# Patient Record
Sex: Male | Born: 1960 | Race: White | Hispanic: No | Marital: Married | Smoking: Never smoker
Health system: Southern US, Community
[De-identification: ages and names within clinical notes are randomized; demographics above are authoritative.]

## PROBLEM LIST (undated history)

## (undated) DIAGNOSIS — M109 Gout, unspecified: Secondary | ICD-10-CM

## (undated) DIAGNOSIS — E739 Lactose intolerance, unspecified: Secondary | ICD-10-CM

## (undated) DIAGNOSIS — I10 Essential (primary) hypertension: Secondary | ICD-10-CM

## (undated) DIAGNOSIS — Z9889 Other specified postprocedural states: Secondary | ICD-10-CM

## (undated) DIAGNOSIS — R112 Nausea with vomiting, unspecified: Secondary | ICD-10-CM

## (undated) DIAGNOSIS — T884XXA Failed or difficult intubation, initial encounter: Secondary | ICD-10-CM

## (undated) DIAGNOSIS — A159 Respiratory tuberculosis unspecified: Secondary | ICD-10-CM

## (undated) DIAGNOSIS — G4730 Sleep apnea, unspecified: Secondary | ICD-10-CM

## (undated) DIAGNOSIS — J45909 Unspecified asthma, uncomplicated: Secondary | ICD-10-CM

## (undated) DIAGNOSIS — T8859XA Other complications of anesthesia, initial encounter: Secondary | ICD-10-CM

## (undated) DIAGNOSIS — R519 Headache, unspecified: Secondary | ICD-10-CM

## (undated) DIAGNOSIS — F909 Attention-deficit hyperactivity disorder, unspecified type: Secondary | ICD-10-CM

## (undated) DIAGNOSIS — E119 Type 2 diabetes mellitus without complications: Secondary | ICD-10-CM

## (undated) DIAGNOSIS — M1990 Unspecified osteoarthritis, unspecified site: Secondary | ICD-10-CM

## (undated) HISTORY — PX: NASAL SINUS SURGERY: SHX719

## (undated) HISTORY — PX: TONSILLECTOMY AND ADENOIDECTOMY: SHX28

## (undated) HISTORY — DX: Sleep apnea, unspecified: G47.30

## (undated) HISTORY — DX: Lactose intolerance, unspecified: E73.9

## (undated) HISTORY — DX: Essential (primary) hypertension: I10

## (undated) HISTORY — DX: Type 2 diabetes mellitus without complications: E11.9

## (undated) HISTORY — DX: Headache, unspecified: R51.9

## (undated) HISTORY — PX: LAPAROSCOPIC GASTRIC SLEEVE RESECTION: SHX5895

## (undated) HISTORY — DX: Unspecified asthma, uncomplicated: J45.909

## (undated) HISTORY — PX: OTHER SURGICAL HISTORY: SHX169

---

## 2002-08-15 ENCOUNTER — Encounter: Admission: RE | Admit: 2002-08-15 | Discharge: 2002-10-26 | Payer: Self-pay | Admitting: Sports Medicine

## 2002-10-21 ENCOUNTER — Encounter: Payer: Self-pay | Admitting: Sports Medicine

## 2002-10-21 ENCOUNTER — Ambulatory Visit (HOSPITAL_COMMUNITY): Admission: RE | Admit: 2002-10-21 | Discharge: 2002-10-21 | Payer: Self-pay | Admitting: Sports Medicine

## 2002-11-07 ENCOUNTER — Encounter: Admission: RE | Admit: 2002-11-07 | Discharge: 2002-12-27 | Payer: Self-pay | Admitting: Sports Medicine

## 2003-08-02 ENCOUNTER — Encounter: Admission: RE | Admit: 2003-08-02 | Discharge: 2003-08-02 | Payer: Self-pay | Admitting: Sports Medicine

## 2003-08-15 ENCOUNTER — Encounter: Admission: RE | Admit: 2003-08-15 | Discharge: 2003-08-15 | Payer: Self-pay | Admitting: Sports Medicine

## 2003-08-29 ENCOUNTER — Encounter: Admission: RE | Admit: 2003-08-29 | Discharge: 2003-08-29 | Payer: Self-pay | Admitting: Sports Medicine

## 2003-11-16 ENCOUNTER — Ambulatory Visit (HOSPITAL_COMMUNITY): Admission: RE | Admit: 2003-11-16 | Discharge: 2003-11-16 | Payer: Self-pay | Admitting: Neurosurgery

## 2003-12-01 ENCOUNTER — Encounter: Admission: RE | Admit: 2003-12-01 | Discharge: 2003-12-01 | Payer: Self-pay | Admitting: Neurosurgery

## 2004-03-07 ENCOUNTER — Inpatient Hospital Stay (HOSPITAL_COMMUNITY): Admission: RE | Admit: 2004-03-07 | Discharge: 2004-03-09 | Payer: Self-pay | Admitting: Neurosurgery

## 2006-12-10 ENCOUNTER — Emergency Department (HOSPITAL_COMMUNITY): Admission: EM | Admit: 2006-12-10 | Discharge: 2006-12-10 | Payer: Self-pay | Admitting: Family Medicine

## 2009-08-21 ENCOUNTER — Emergency Department (HOSPITAL_COMMUNITY): Admission: EM | Admit: 2009-08-21 | Discharge: 2009-08-21 | Payer: Self-pay | Admitting: Family Medicine

## 2010-09-05 NOTE — Op Note (Signed)
NAMEGURKARAN, RAHM               ACCOUNT NO.:  [id number]   MEDICAL RECORD NO.:  [id number]          PATIENT TYPE:  INP   LOCATION:  3031                         FACILITY:  [facility]   PHYSICIAN:  Hilda Lias, M.D.   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   PROCEDURE:  Bilateral T10-11 laminectomy and decompression of spinal cord.   SURGEON:  Hilda Lias, M.D.   ASSISTANT:  Lovell Sheehan.   CLINICAL HISTORY:  The patient was admitted because of back pain that  radiates to the buttocks area.  The patient has had this problem since 4/04  where he was _____.  The patient had a lumbar MRI, which was unremarkable,  but the thoracic MRI showed that he has stenosis at the level of 10-11  secondary to degenerative disk disease and calcified yellow ligament to the  facet.  The patient wished to proceed with surgery.  The risks were  explained to him, including the possibility of infection, CSF leak, no  improvement whatsoever, and need for further surgery.   PROCEDURE IN DETAIL:  The patient was taken to the OR.  The back was sprayed  with Betadine.  An x-ray was taken, which showed that the upper mid was at  the level 5 thoracic level.  From then on, we opened the midline from T10 to  T11.  We retracted the muscle all the way laterally.  We repeated another x-  ray, which showed that indeed we were at T10-11.  This spinous process was  removed by using the drill with the retractor.  Indeed, we found quite a bit  of calcified ligament, and decompression of the spinal cord was achieved.  above 10 and below 11, he had plenty of space there.  Having done this, the  area was irrigated.  Valsalva maneuver was negative.  The wound was closed  with Vicryl and Steri-Strips.  _____ was  left in the dural space.       EB/MEDQ  D:  03/07/2004  T:  03/08/2004  Job:  [id number]

## 2013-05-19 DIAGNOSIS — M5136 Other intervertebral disc degeneration, lumbar region: Secondary | ICD-10-CM | POA: Insufficient documentation

## 2014-09-14 DIAGNOSIS — E669 Obesity, unspecified: Secondary | ICD-10-CM | POA: Insufficient documentation

## 2014-09-14 DIAGNOSIS — I10 Essential (primary) hypertension: Secondary | ICD-10-CM | POA: Insufficient documentation

## 2014-09-14 DIAGNOSIS — D141 Benign neoplasm of larynx: Secondary | ICD-10-CM | POA: Insufficient documentation

## 2014-12-27 DIAGNOSIS — M545 Low back pain, unspecified: Secondary | ICD-10-CM | POA: Insufficient documentation

## 2014-12-27 DIAGNOSIS — G8929 Other chronic pain: Secondary | ICD-10-CM | POA: Insufficient documentation

## 2015-04-04 DIAGNOSIS — R9389 Abnormal findings on diagnostic imaging of other specified body structures: Secondary | ICD-10-CM | POA: Insufficient documentation

## 2015-04-11 DIAGNOSIS — R7309 Other abnormal glucose: Secondary | ICD-10-CM | POA: Insufficient documentation

## 2015-05-10 DIAGNOSIS — M4800 Spinal stenosis, site unspecified: Secondary | ICD-10-CM | POA: Insufficient documentation

## 2015-06-19 DIAGNOSIS — G4730 Sleep apnea, unspecified: Secondary | ICD-10-CM | POA: Insufficient documentation

## 2015-06-25 DIAGNOSIS — R7989 Other specified abnormal findings of blood chemistry: Secondary | ICD-10-CM | POA: Insufficient documentation

## 2015-06-25 DIAGNOSIS — E559 Vitamin D deficiency, unspecified: Secondary | ICD-10-CM | POA: Insufficient documentation

## 2015-06-27 DIAGNOSIS — D229 Melanocytic nevi, unspecified: Secondary | ICD-10-CM | POA: Insufficient documentation

## 2015-06-27 DIAGNOSIS — D2372 Other benign neoplasm of skin of left lower limb, including hip: Secondary | ICD-10-CM | POA: Insufficient documentation

## 2015-07-16 HISTORY — PX: COLONOSCOPY: SHX174

## 2015-07-25 DIAGNOSIS — G4733 Obstructive sleep apnea (adult) (pediatric): Secondary | ICD-10-CM | POA: Insufficient documentation

## 2016-04-07 DIAGNOSIS — F32A Depression, unspecified: Secondary | ICD-10-CM | POA: Insufficient documentation

## 2017-09-27 DIAGNOSIS — M797 Fibromyalgia: Secondary | ICD-10-CM | POA: Diagnosis not present

## 2017-09-27 DIAGNOSIS — I10 Essential (primary) hypertension: Secondary | ICD-10-CM | POA: Diagnosis not present

## 2017-09-27 DIAGNOSIS — E291 Testicular hypofunction: Secondary | ICD-10-CM | POA: Diagnosis not present

## 2017-09-27 DIAGNOSIS — Z79899 Other long term (current) drug therapy: Secondary | ICD-10-CM | POA: Diagnosis not present

## 2017-10-07 DIAGNOSIS — I10 Essential (primary) hypertension: Secondary | ICD-10-CM | POA: Diagnosis not present

## 2017-10-07 DIAGNOSIS — N529 Male erectile dysfunction, unspecified: Secondary | ICD-10-CM | POA: Diagnosis not present

## 2017-10-07 DIAGNOSIS — G4733 Obstructive sleep apnea (adult) (pediatric): Secondary | ICD-10-CM | POA: Diagnosis not present

## 2017-10-07 DIAGNOSIS — M47812 Spondylosis without myelopathy or radiculopathy, cervical region: Secondary | ICD-10-CM | POA: Diagnosis not present

## 2017-10-07 DIAGNOSIS — R51 Headache: Secondary | ICD-10-CM | POA: Diagnosis not present

## 2017-10-07 DIAGNOSIS — M5136 Other intervertebral disc degeneration, lumbar region: Secondary | ICD-10-CM | POA: Diagnosis not present

## 2017-10-07 DIAGNOSIS — E291 Testicular hypofunction: Secondary | ICD-10-CM | POA: Diagnosis not present

## 2017-10-07 DIAGNOSIS — H9319 Tinnitus, unspecified ear: Secondary | ICD-10-CM | POA: Diagnosis not present

## 2017-10-07 DIAGNOSIS — M797 Fibromyalgia: Secondary | ICD-10-CM | POA: Diagnosis not present

## 2017-10-07 DIAGNOSIS — A0100 Typhoid fever, unspecified: Secondary | ICD-10-CM | POA: Diagnosis not present

## 2017-10-11 ENCOUNTER — Ambulatory Visit (INDEPENDENT_AMBULATORY_CARE_PROVIDER_SITE_OTHER): Payer: Medicare HMO | Admitting: Cardiovascular Disease

## 2017-10-11 ENCOUNTER — Encounter: Payer: Self-pay | Admitting: Cardiovascular Disease

## 2017-10-11 VITALS — BP 138/88 | HR 57 | Ht 73.0 in | Wt 250.0 lb

## 2017-10-11 DIAGNOSIS — R079 Chest pain, unspecified: Secondary | ICD-10-CM

## 2017-10-11 DIAGNOSIS — R002 Palpitations: Secondary | ICD-10-CM | POA: Diagnosis not present

## 2017-10-11 DIAGNOSIS — I10 Essential (primary) hypertension: Secondary | ICD-10-CM | POA: Diagnosis not present

## 2017-10-11 NOTE — Progress Notes (Signed)
CARDIOLOGY CONSULT NOTE  Patient ID: PEDER ALLUMS MRN: [id number] DOB/AGE: 55-Nov-1962 55 y.o.  Admit date: (Not on file) Primary Physician: Deland Pretty, MD Referring Physician: Deland Pretty, MD  Reason for Consultation: Palpitations  HPI: Luke Austin is a 57 y.o. male who is being seen today for the evaluation of palpitations at the request of Deland Pretty, MD.   Past medical history includes hypertension.  I reviewed notes from his PCP whom he saw on 10/07/2017.  He has a history of degenerative joint disease of the spine and has undergone surgery.  He underwent bariatric surgery in 2017.  Blood pressure was 152/100 at his PCPs office.  I reviewed labs dated 09/27/2017: BUN 21, creatinine 1, sodium 142, potassium 3.6, white blood cells 6.5, hemoglobin 14.6, platelets 177.  I personally reviewed his ECG performed today which demonstrates sinus bradycardia, 59 bpm, old inferior infarct pattern, and nonspecific ST segment and T wave abnormalities seen inferiorly and V4 through V6.  For the past 6 months, he has been having intermittent chest pains lasting anywhere from 2 to 3 minutes.  He said it primarily occurs at rest.  He denies associated shortness of breath.  He denies exertional chest pain and dyspnea.  He denies lightheadedness, dizziness, leg swelling, and syncope.  He has also noticed an irregular heart rhythm over the past 6 months.  This is not associated with chest pain or shortness of breath.  He does not get much activity as he is undergone 3 prior back surgeries.  He was diagnosed with hypertension at the age of 89.  He was diagnosed with decreased testosterone levels and his PCP wanted him to see a cardiologist prior to undergoing hormone replacement therapy area  He also has a history of sleep apnea.  Family history: His father developed coronary artery disease in his mid to late 42s.  He has had 2 MIs.  He also has atrial fibrillation.  He was a  non-smoker.  He is 57 years of age.  Social history: He and his wife are originally from [place].   No Known Allergies  Current Outpatient Medications  Medication Sig Dispense Refill  . Ferrous Sulfate (IRON) 325 (65 Fe) MG TABS Take by mouth.    . gabapentin (NEURONTIN) 300 MG capsule TAKE THREE CAPSULES BY MOUTH THREE TIMES DAILY AS NEEDED FOR PAIN  6  . hydrochlorothiazide (HYDRODIURIL) 25 MG tablet TAKE 1 TABLET BY MOUTH ONCE DAILY TAKE WITH 1 GLASS OF OJ OR BANANA  3  . loratadine (CLARITIN) 10 MG tablet Take by mouth.    . metoprolol tartrate (LOPRESSOR) 50 MG tablet TAKE 1 & 1 2 (ONE & ONE HALF) TABLETS BY MOUTH TWICE DAILY  3  . telmisartan (MICARDIS) 40 MG tablet TAKE 1 TABLET BY MOUTH ONCE DAILY FOR 30 DAYS  3  . tiZANidine (ZANAFLEX) 4 MG tablet Take 4 mg by mouth every 6 (six) hours as needed.  6  . vitamin B-12 (CYANOCOBALAMIN) 100 MCG tablet Take 100 mcg by mouth daily.    . vitamin C (ASCORBIC ACID) 500 MG tablet Take 500 mg by mouth daily.     No current facility-administered medications for this visit.     Past Medical History:  Diagnosis Date  . Asthma   . Hypertension   . Sleep apnea     Past Surgical History:  Procedure Laterality Date  . back surgeries    . LAPAROSCOPIC GASTRIC SLEEVE RESECTION    .  TONSILLECTOMY AND ADENOIDECTOMY    . tumor removal off vocal cords      Social History   Socioeconomic History  . Marital status: Married    Spouse name: Not on file  . Number of children: Not on file  . Years of education: Not on file  . Highest education level: Not on file  Occupational History  . Not on file  Social Needs  . Financial resource strain: Not on file  . Food insecurity:    Worry: Not on file    Inability: Not on file  . Transportation needs:    Medical: Not on file    Non-medical: Not on file  Tobacco Use  . Smoking status: Never Smoker  . Smokeless tobacco: Never Used  Substance and Sexual Activity  . Alcohol use: Never     Frequency: Never  . Drug use: Never  . Sexual activity: Not on file  Lifestyle  . Physical activity:    Days per week: Not on file    Minutes per session: Not on file  . Stress: Not on file  Relationships  . Social connections:    Talks on phone: Not on file    Gets together: Not on file    Attends religious service: Not on file    Active member of club or organization: Not on file    Attends meetings of clubs or organizations: Not on file    Relationship status: Not on file  . Intimate partner violence:    Fear of current or ex partner: Not on file    Emotionally abused: Not on file    Physically abused: Not on file    Forced sexual activity: Not on file  Other Topics Concern  . Not on file  Social History Narrative  . Not on file     Current Meds  Medication Sig  . Ferrous Sulfate (IRON) 325 (65 Fe) MG TABS Take by mouth.  . gabapentin (NEURONTIN) 300 MG capsule TAKE THREE CAPSULES BY MOUTH THREE TIMES DAILY AS NEEDED FOR PAIN  . hydrochlorothiazide (HYDRODIURIL) 25 MG tablet TAKE 1 TABLET BY MOUTH ONCE DAILY TAKE WITH 1 GLASS OF OJ OR BANANA  . loratadine (CLARITIN) 10 MG tablet Take by mouth.  . metoprolol tartrate (LOPRESSOR) 50 MG tablet TAKE 1 & 1 2 (ONE & ONE HALF) TABLETS BY MOUTH TWICE DAILY  . telmisartan (MICARDIS) 40 MG tablet TAKE 1 TABLET BY MOUTH ONCE DAILY FOR 30 DAYS  . tiZANidine (ZANAFLEX) 4 MG tablet Take 4 mg by mouth every 6 (six) hours as needed.  . vitamin B-12 (CYANOCOBALAMIN) 100 MCG tablet Take 100 mcg by mouth daily.  . vitamin C (ASCORBIC ACID) 500 MG tablet Take 500 mg by mouth daily.      Review of systems complete and found to be negative unless listed above in HPI    Physical exam Blood pressure 138/88, pulse (!) 57, height 6\' 1"  (1.854 m), weight 250 lb (113.4 kg), SpO2 97 %. General: NAD Neck: No JVD, no thyromegaly or thyroid nodule.  Lungs: Clear to auscultation bilaterally with normal respiratory effort. CV: Nondisplaced  PMI. Regular rate and rhythm, normal S1/S2, no S3/S4, no murmur.  No peripheral edema.  No carotid bruit.    Abdomen: Soft, nontender, no distention.  Skin: Intact without lesions or rashes.  Neurologic: Alert and oriented x 3.  Psych: Normal affect. Extremities: No clubbing or cyanosis.  HEENT: Normal.   ECG: Most recent ECG reviewed.   Labs:  No results found for: K, BUN, CREATININE, ALT, TSH, HGB   Lipids: No results found for: LDLCALC, LDLDIRECT, CHOL, TRIG, HDL      ASSESSMENT AND PLAN:  1.  Chest pain: Somewhat atypical symptoms in that they occur primarily at rest.  He does have an abnormal resting ECG as detailed above.  Given his several prior back surgeries, he is unable to walk on a treadmill. I will proceed with a nuclear myocardial perfusion imaging study to evaluate for ischemic heart disease (Lexiscan Myoview).  2.  Palpitations: I will obtain a 3-week event monitor to assess for arrhythmias.  I would consider an echocardiogram in the future area  3.  Hypertension: Blood pressure is suboptimally controlled.  No changes to medications today.     Disposition: Follow up in 2 months  Signed: Kate Sable, M.D., F.A.C.C.  10/11/2017, 4:29 PM

## 2017-10-11 NOTE — Patient Instructions (Signed)
Your physician recommends that you schedule a follow-up appointment in: 2 months with [facility]     Your physician recommends that you continue on your current medications as directed. Please refer to the Current Medication list given to you today.    If you need a refill on your cardiac medications before your next appointment, please call your pharmacy.   Your physician has recommended that you wear an event monitor for 21 days. Event monitors are medical devices that record the heart's electrical activity. Doctors most often [place] these monitors to diagnose arrhythmias. Arrhythmias are problems with the speed or rhythm of the heartbeat. The monitor is a small, portable device. You can wear one while you do your normal daily activities. This is usually used to diagnose what is causing palpitations/syncope (passing out).     Your physician has requested that you have a lexiscan myoview. For further information please visit [email or web address]. Please follow instruction sheet, as given.     Thank you for choosing [facility] !

## 2017-10-13 ENCOUNTER — Ambulatory Visit (INDEPENDENT_AMBULATORY_CARE_PROVIDER_SITE_OTHER): Payer: Medicare HMO

## 2017-10-13 DIAGNOSIS — R002 Palpitations: Secondary | ICD-10-CM

## 2017-10-14 ENCOUNTER — Encounter (HOSPITAL_COMMUNITY): Admission: RE | Admit: 2017-10-14 | Payer: Medicare HMO | Source: Ambulatory Visit

## 2017-10-14 ENCOUNTER — Encounter (HOSPITAL_COMMUNITY): Payer: Medicare HMO

## 2017-10-28 DIAGNOSIS — R2231 Localized swelling, mass and lump, right upper limb: Secondary | ICD-10-CM | POA: Diagnosis not present

## 2017-10-28 DIAGNOSIS — J45909 Unspecified asthma, uncomplicated: Secondary | ICD-10-CM | POA: Diagnosis not present

## 2017-11-01 ENCOUNTER — Ambulatory Visit: Payer: Medicare HMO | Admitting: Orthopedic Surgery

## 2017-11-01 ENCOUNTER — Encounter

## 2017-11-03 ENCOUNTER — Other Ambulatory Visit: Payer: Self-pay | Admitting: Orthopedic Surgery

## 2017-11-03 ENCOUNTER — Telehealth: Payer: Self-pay | Admitting: *Deleted

## 2017-11-03 DIAGNOSIS — M19042 Primary osteoarthritis, left hand: Secondary | ICD-10-CM | POA: Diagnosis not present

## 2017-11-03 DIAGNOSIS — I10 Essential (primary) hypertension: Secondary | ICD-10-CM | POA: Diagnosis not present

## 2017-11-03 DIAGNOSIS — J45909 Unspecified asthma, uncomplicated: Secondary | ICD-10-CM | POA: Diagnosis not present

## 2017-11-03 DIAGNOSIS — Z831 Family history of other infectious and parasitic diseases: Secondary | ICD-10-CM | POA: Diagnosis not present

## 2017-11-03 DIAGNOSIS — R229 Localized swelling, mass and lump, unspecified: Secondary | ICD-10-CM | POA: Diagnosis not present

## 2017-11-03 NOTE — Telephone Encounter (Signed)
-----   Message from Herminio Commons, MD sent at 11/02/2017  5:00 PM EDT ----- No significant arrhythmias.

## 2017-11-03 NOTE — Telephone Encounter (Signed)
Called patient with test results. No answer. Left message to call back.  

## 2017-11-08 DIAGNOSIS — R0789 Other chest pain: Secondary | ICD-10-CM | POA: Diagnosis not present

## 2017-11-08 DIAGNOSIS — R51 Headache: Secondary | ICD-10-CM | POA: Diagnosis not present

## 2017-11-08 DIAGNOSIS — I10 Essential (primary) hypertension: Secondary | ICD-10-CM | POA: Diagnosis not present

## 2017-11-10 ENCOUNTER — Encounter (HOSPITAL_COMMUNITY): Admission: RE | Admit: 2017-11-10 | Payer: Medicare HMO | Source: Ambulatory Visit

## 2017-11-10 ENCOUNTER — Encounter (HOSPITAL_COMMUNITY): Payer: Medicare HMO

## 2017-11-11 ENCOUNTER — Other Ambulatory Visit: Payer: Self-pay

## 2017-11-11 ENCOUNTER — Encounter (HOSPITAL_BASED_OUTPATIENT_CLINIC_OR_DEPARTMENT_OTHER): Payer: Self-pay | Admitting: *Deleted

## 2017-11-11 ENCOUNTER — Encounter (HOSPITAL_BASED_OUTPATIENT_CLINIC_OR_DEPARTMENT_OTHER)
Admission: RE | Admit: 2017-11-11 | Discharge: 2017-11-11 | Disposition: A | Discharge status: Home / Self Care | Payer: Medicare HMO | Source: Ambulatory Visit | Attending: Orthopedic Surgery | Admitting: Orthopedic Surgery

## 2017-11-11 DIAGNOSIS — Z01812 Encounter for preprocedural laboratory examination: Secondary | ICD-10-CM | POA: Diagnosis not present

## 2017-11-11 LAB — BASIC METABOLIC PANEL
Anion gap: 7 (ref 5–15)
BUN: 15 mg/dL (ref 6–20)
CO2: 30 mmol/L (ref 22–32)
Calcium: 9.6 mg/dL (ref 8.9–10.3)
Chloride: 103 mmol/L (ref 98–111)
Creatinine, Ser: 0.86 mg/dL (ref 0.61–1.24)
GFR calc Af Amer: >60 mL/min (ref >60)
GFR calc non Af Amer: >60 mL/min (ref >60)
Glucose, Bld: 110 mg/dL — ABNORMAL HIGH (ref 70–99)
Potassium: 4.5 mmol/L (ref 3.5–5.1)
Sodium: 140 mmol/L (ref 135–145)

## 2017-11-12 ENCOUNTER — Encounter (HOSPITAL_COMMUNITY)
Admission: RE | Admit: 2017-11-12 | Discharge: 2017-11-12 | Disposition: A | Discharge status: Home / Self Care | Payer: Medicare HMO | Source: Ambulatory Visit | Attending: Cardiovascular Disease | Admitting: Cardiovascular Disease

## 2017-11-12 ENCOUNTER — Encounter (HOSPITAL_COMMUNITY): Payer: Self-pay

## 2017-11-12 ENCOUNTER — Ambulatory Visit (HOSPITAL_COMMUNITY)
Admission: RE | Admit: 2017-11-12 | Discharge: 2017-11-12 | Disposition: A | Discharge status: Home / Self Care | Payer: Medicare HMO | Source: Ambulatory Visit | Attending: Internal Medicine | Admitting: Internal Medicine

## 2017-11-12 DIAGNOSIS — R079 Chest pain, unspecified: Secondary | ICD-10-CM | POA: Insufficient documentation

## 2017-11-12 LAB — NM MYOCAR MULTI W/SPECT W/WALL MOTION / EF
LV dias vol: 115 mL (ref 62–150)
LV sys vol: 43 mL
Peak HR: 79 {beats}/min
RATE: 0.37
Rest HR: 55 {beats}/min
SDS: 3
SRS: 1
SSS: 4
TID: 0.75

## 2017-11-12 MED ORDER — SODIUM CHLORIDE 0.9% FLUSH
INTRAVENOUS | Status: AC
  Administered 2017-11-12: 10 mL via INTRAVENOUS
  Filled 2017-11-12: qty 10

## 2017-11-12 MED ORDER — TECHNETIUM TC 99M TETROFOSMIN IV KIT
10.0000 | PACK | Freq: Once | INTRAVENOUS | Status: AC | PRN
  Administered 2017-11-12: 11 via INTRAVENOUS

## 2017-11-12 MED ORDER — TECHNETIUM TC 99M TETROFOSMIN IV KIT
30.0000 | PACK | Freq: Once | INTRAVENOUS | Status: AC | PRN
  Administered 2017-11-12: 32 via INTRAVENOUS

## 2017-11-12 MED ORDER — REGADENOSON 0.4 MG/5ML IV SOLN
INTRAVENOUS | Status: AC
  Administered 2017-11-12: 0.4 mg via INTRAVENOUS
  Filled 2017-11-12: qty 5

## 2017-11-16 ENCOUNTER — Encounter: Payer: Self-pay | Admitting: *Deleted

## 2017-11-16 ENCOUNTER — Telehealth: Payer: Self-pay | Admitting: *Deleted

## 2017-11-16 NOTE — Telephone Encounter (Signed)
-----   Message from Arnoldo Lenis, MD sent at 11/15/2017  1:42 PM EDT ----- Stress test looks good, overall no strong evidence of significant blockages. Keep f/u with Dr Ronelle Nigh BrancH MD

## 2017-11-16 NOTE — Telephone Encounter (Signed)
This encounter was created in error - please disregard.

## 2017-11-16 NOTE — Telephone Encounter (Signed)
Patient informed. 

## 2017-11-18 ENCOUNTER — Ambulatory Visit (HOSPITAL_BASED_OUTPATIENT_CLINIC_OR_DEPARTMENT_OTHER)
Admission: RE | Admit: 2017-11-18 | Discharge: 2017-11-18 | Disposition: A | Discharge status: Home / Self Care | Payer: Medicare HMO | Source: Ambulatory Visit | Attending: Orthopedic Surgery | Admitting: Orthopedic Surgery

## 2017-11-18 ENCOUNTER — Encounter (HOSPITAL_BASED_OUTPATIENT_CLINIC_OR_DEPARTMENT_OTHER): Payer: Self-pay | Admitting: Anesthesiology

## 2017-11-18 ENCOUNTER — Other Ambulatory Visit: Payer: Self-pay

## 2017-11-18 ENCOUNTER — Encounter (HOSPITAL_BASED_OUTPATIENT_CLINIC_OR_DEPARTMENT_OTHER)
Admission: RE | Disposition: A | Discharge status: Home / Self Care | Payer: Self-pay | Source: Ambulatory Visit | Attending: Orthopedic Surgery

## 2017-11-18 ENCOUNTER — Ambulatory Visit (HOSPITAL_BASED_OUTPATIENT_CLINIC_OR_DEPARTMENT_OTHER): Payer: Medicare HMO | Admitting: Anesthesiology

## 2017-11-18 DIAGNOSIS — G4730 Sleep apnea, unspecified: Secondary | ICD-10-CM | POA: Diagnosis not present

## 2017-11-18 DIAGNOSIS — M19041 Primary osteoarthritis, right hand: Secondary | ICD-10-CM | POA: Diagnosis not present

## 2017-11-18 DIAGNOSIS — M67441 Ganglion, right hand: Secondary | ICD-10-CM | POA: Insufficient documentation

## 2017-11-18 DIAGNOSIS — R2231 Localized swelling, mass and lump, right upper limb: Secondary | ICD-10-CM | POA: Diagnosis not present

## 2017-11-18 DIAGNOSIS — J45909 Unspecified asthma, uncomplicated: Secondary | ICD-10-CM | POA: Insufficient documentation

## 2017-11-18 DIAGNOSIS — I10 Essential (primary) hypertension: Secondary | ICD-10-CM | POA: Diagnosis not present

## 2017-11-18 DIAGNOSIS — E669 Obesity, unspecified: Secondary | ICD-10-CM | POA: Insufficient documentation

## 2017-11-18 DIAGNOSIS — Z79899 Other long term (current) drug therapy: Secondary | ICD-10-CM | POA: Insufficient documentation

## 2017-11-18 DIAGNOSIS — M1990 Unspecified osteoarthritis, unspecified site: Secondary | ICD-10-CM | POA: Diagnosis not present

## 2017-11-18 DIAGNOSIS — Z9884 Bariatric surgery status: Secondary | ICD-10-CM | POA: Insufficient documentation

## 2017-11-18 DIAGNOSIS — M25741 Osteophyte, right hand: Secondary | ICD-10-CM | POA: Diagnosis not present

## 2017-11-18 DIAGNOSIS — Z6832 Body mass index (BMI) 32.0-32.9, adult: Secondary | ICD-10-CM | POA: Insufficient documentation

## 2017-11-18 HISTORY — PX: EXCISION MASS UPPER EXTREMETIES: SHX6704

## 2017-11-18 SURGERY — EXCISION MASS UPPER EXTREMITIES
Anesthesia: Monitor Anesthesia Care | Site: Finger | Laterality: Right

## 2017-11-18 MED ORDER — FENTANYL CITRATE (PF) 100 MCG/2ML IJ SOLN: 50.0000 ug | INTRAMUSCULAR | Status: DC | PRN

## 2017-11-18 MED ORDER — LACTATED RINGERS IV SOLN
INTRAVENOUS | Status: DC
  Administered 2017-11-18 (×2): via INTRAVENOUS

## 2017-11-18 MED ORDER — FENTANYL CITRATE (PF) 100 MCG/2ML IJ SOLN
INTRAMUSCULAR | Status: AC
  Filled 2017-11-18: qty 2

## 2017-11-18 MED ORDER — ONDANSETRON HCL 4 MG/2ML IJ SOLN
INTRAMUSCULAR | Status: AC
  Filled 2017-11-18: qty 2

## 2017-11-18 MED ORDER — TRAMADOL HCL 50 MG PO TABS: 50.0000 mg | ORAL_TABLET | Freq: Four times a day (QID) | ORAL | 0 refills | Status: DC | PRN

## 2017-11-18 MED ORDER — OXYCODONE HCL 5 MG PO TABS: 5.0000 mg | ORAL_TABLET | Freq: Once | ORAL | Status: DC | PRN

## 2017-11-18 MED ORDER — PROPOFOL 500 MG/50ML IV EMUL
INTRAVENOUS | Status: DC | PRN
  Administered 2017-11-18: 25 ug/kg/min via INTRAVENOUS

## 2017-11-18 MED ORDER — OXYCODONE HCL 5 MG/5ML PO SOLN: 5.0000 mg | Freq: Once | ORAL | Status: DC | PRN

## 2017-11-18 MED ORDER — CHLORHEXIDINE GLUCONATE 4 % EX LIQD: 60.0000 mL | Freq: Once | CUTANEOUS | Status: DC

## 2017-11-18 MED ORDER — PROPOFOL 10 MG/ML IV BOLUS
INTRAVENOUS | Status: AC
  Filled 2017-11-18: qty 20

## 2017-11-18 MED ORDER — PROMETHAZINE HCL 25 MG/ML IJ SOLN: 6.2500 mg | INTRAMUSCULAR | Status: DC | PRN

## 2017-11-18 MED ORDER — HYDROMORPHONE HCL 1 MG/ML IJ SOLN: 0.2500 mg | INTRAMUSCULAR | Status: DC | PRN

## 2017-11-18 MED ORDER — MIDAZOLAM HCL 2 MG/2ML IJ SOLN: 1.0000 mg | INTRAMUSCULAR | Status: DC | PRN

## 2017-11-18 MED ORDER — CEFAZOLIN SODIUM-DEXTROSE 2-4 GM/100ML-% IV SOLN
INTRAVENOUS | Status: AC
  Filled 2017-11-18: qty 100

## 2017-11-18 MED ORDER — SCOPOLAMINE 1 MG/3DAYS TD PT72: 1.0000 | MEDICATED_PATCH | Freq: Once | TRANSDERMAL | Status: DC | PRN

## 2017-11-18 MED ORDER — CEFAZOLIN SODIUM-DEXTROSE 2-4 GM/100ML-% IV SOLN
2.0000 g | INTRAVENOUS | Status: AC
  Administered 2017-11-18: 2 g via INTRAVENOUS

## 2017-11-18 MED ORDER — BUPIVACAINE HCL (PF) 0.25 % IJ SOLN
INTRAMUSCULAR | Status: DC | PRN
  Administered 2017-11-18: 9 mL

## 2017-11-18 MED ORDER — LIDOCAINE HCL (CARDIAC) PF 100 MG/5ML IV SOSY
PREFILLED_SYRINGE | INTRAVENOUS | Status: AC
Start: 2017-11-18 — End: ?
  Filled 2017-11-18: qty 5

## 2017-11-18 MED ORDER — MIDAZOLAM HCL 2 MG/2ML IJ SOLN
INTRAMUSCULAR | Status: AC
  Filled 2017-11-18: qty 2

## 2017-11-18 SURGICAL SUPPLY — 54 items
BANDAGE COBAN STERILE 2 (GAUZE/BANDAGES/DRESSINGS) IMPLANT
BLADE MINI RND TIP GREEN BEAV (BLADE) IMPLANT
BLADE SURG 15 STRL LF DISP TIS (BLADE) ×1 IMPLANT
BLADE SURG 15 STRL SS (BLADE) ×2
BNDG CMPR 9X4 STRL LF SNTH (GAUZE/BANDAGES/DRESSINGS)
BNDG COHESIVE 1X5 TAN STRL LF (GAUZE/BANDAGES/DRESSINGS) ×1 IMPLANT
BNDG COHESIVE 3X5 TAN STRL LF (GAUZE/BANDAGES/DRESSINGS) IMPLANT
BNDG ESMARK 4X9 LF (GAUZE/BANDAGES/DRESSINGS) IMPLANT
BNDG GAUZE ELAST 4 BULKY (GAUZE/BANDAGES/DRESSINGS) IMPLANT
CHLORAPREP W/TINT 26ML (MISCELLANEOUS) ×2 IMPLANT
CORD BIPOLAR FORCEPS 12FT (ELECTRODE) ×2 IMPLANT
COVER BACK TABLE 60X90IN (DRAPES) ×2 IMPLANT
COVER MAYO STAND STRL (DRAPES) ×2 IMPLANT
CUFF TOURNIQUET SINGLE 18IN (TOURNIQUET CUFF) ×1 IMPLANT
DECANTER SPIKE VIAL GLASS SM (MISCELLANEOUS) IMPLANT
DRAIN PENROSE 1/2X12 LTX STRL (WOUND CARE) IMPLANT
DRAPE EXTREMITY T 121X128X90 (DRAPE) ×2 IMPLANT
DRAPE SURG 17X23 STRL (DRAPES) ×2 IMPLANT
GAUZE SPONGE 4X4 12PLY STRL (GAUZE/BANDAGES/DRESSINGS) ×2 IMPLANT
GAUZE XEROFORM 1X8 LF (GAUZE/BANDAGES/DRESSINGS) ×2 IMPLANT
GLOVE BIOGEL PI IND STRL 7.0 (GLOVE) IMPLANT
GLOVE BIOGEL PI IND STRL 8.5 (GLOVE) ×1 IMPLANT
GLOVE BIOGEL PI INDICATOR 7.0 (GLOVE) ×1
GLOVE BIOGEL PI INDICATOR 8.5 (GLOVE) ×1
GLOVE SURG ORTHO 8.0 STRL STRW (GLOVE) ×2 IMPLANT
GLOVE SURG SYN 7.5  E (GLOVE) ×1
GLOVE SURG SYN 7.5 E (GLOVE) ×1 IMPLANT
GLOVE SURG SYN 7.5 PF PI (GLOVE) IMPLANT
GOWN STRL REUS W/ TWL LRG LVL3 (GOWN DISPOSABLE) ×1 IMPLANT
GOWN STRL REUS W/TWL LRG LVL3 (GOWN DISPOSABLE)
GOWN STRL REUS W/TWL XL LVL3 (GOWN DISPOSABLE) ×2 IMPLANT
NDL PRECISIONGLIDE 27X1.5 (NEEDLE) IMPLANT
NDL SAFETY ECLIPSE 18X1.5 (NEEDLE) ×1 IMPLANT
NEEDLE HYPO 18GX1.5 SHARP (NEEDLE) ×2
NEEDLE PRECISIONGLIDE 27X1.5 (NEEDLE) ×2 IMPLANT
NS IRRIG 1000ML POUR BTL (IV SOLUTION) ×2 IMPLANT
PACK BASIN DAY SURGERY FS (CUSTOM PROCEDURE TRAY) ×2 IMPLANT
PAD CAST 3X4 CTTN HI CHSV (CAST SUPPLIES) IMPLANT
PADDING CAST ABS 3INX4YD NS (CAST SUPPLIES)
PADDING CAST ABS 4INX4YD NS (CAST SUPPLIES) ×1
PADDING CAST ABS COTTON 3X4 (CAST SUPPLIES) IMPLANT
PADDING CAST ABS COTTON 4X4 ST (CAST SUPPLIES) ×1 IMPLANT
PADDING CAST COTTON 3X4 STRL (CAST SUPPLIES) ×2
SPLINT FINGER 3.25 911903 (SOFTGOODS) ×1 IMPLANT
SPLINT PLASTER CAST XFAST 3X15 (CAST SUPPLIES) IMPLANT
SPLINT PLASTER XTRA FASTSET 3X (CAST SUPPLIES)
STOCKINETTE 4X48 STRL (DRAPES) ×2 IMPLANT
SUT ETHILON 4 0 PS 2 18 (SUTURE) ×2 IMPLANT
SUT MERSILENE 5 0 P 3 (SUTURE) ×1 IMPLANT
SUT VIC AB 4-0 P2 18 (SUTURE) IMPLANT
SYR BULB 3OZ (MISCELLANEOUS) ×2 IMPLANT
SYR CONTROL 10ML LL (SYRINGE) ×1 IMPLANT
TOWEL GREEN STERILE FF (TOWEL DISPOSABLE) ×4 IMPLANT
UNDERPAD 30X30 (UNDERPADS AND DIAPERS) ×2 IMPLANT

## 2017-11-18 NOTE — Anesthesia Preprocedure Evaluation (Signed)
Anesthesia Evaluation  Patient identified by MRN, date of birth, ID band Patient awake    Reviewed: Allergy & Precautions, NPO status , Patient's Chart, lab work & pertinent test results, reviewed documented beta blocker date and time   Airway Mallampati: II  TM Distance: >3 FB Neck ROM: Full    Dental no notable dental hx.    Pulmonary neg pulmonary ROS, asthma , sleep apnea ,    Pulmonary exam normal breath sounds clear to auscultation       Cardiovascular hypertension, Pt. on medications and Pt. on home beta blockers negative cardio ROS Normal cardiovascular exam Rhythm:Regular Rate:Normal     Neuro/Psych negative neurological ROS  negative psych ROS   GI/Hepatic negative GI ROS, Neg liver ROS,   Endo/Other  negative endocrine ROS  Renal/GU negative Renal ROS  negative genitourinary   Musculoskeletal negative musculoskeletal ROS (+)   Abdominal (+) + obese,   Peds negative pediatric ROS (+)  Hematology negative hematology ROS (+)   Anesthesia Other Findings   Reproductive/Obstetrics negative OB ROS                             Anesthesia Physical Anesthesia Plan  ASA: II  Anesthesia Plan: MAC and Bier Block and Bier Block-LIDOCAINE ONLY   Post-op Pain Management:    Induction: Intravenous  PONV Risk Score and Plan: 1 and Ondansetron  Airway Management Planned: Simple Face Mask  Additional Equipment:   Intra-op Plan:   Post-operative Plan:   Informed Consent: I have reviewed the patients History and Physical, chart, labs and discussed the procedure including the risks, benefits and alternatives for the proposed anesthesia with the patient or authorized representative who has indicated his/her understanding and acceptance.   Dental advisory given  Plan Discussed with: CRNA  Anesthesia Plan Comments:         Anesthesia Quick Evaluation

## 2017-11-18 NOTE — H&P (Signed)
Luke Austin is an 57 y.o. male.   Chief Complaint: mass right ring finger HPI: Luke Austin is a 57 year old right-hand-dominant male comes in referred by Dr. Concha Pyo for consultation regarding a mass on his right ring finger.. This has been present for approximately 55 months. He states he had a bit 2 years ago it disappeared on its own now has returned. He states it is large and become painful especially if he hits it with a VAS score 8-10 over 10 sharp in nature. He recalls no history of injury. He does have a history of arthritis. He has not had any treatment or tried anything for this. Is complaining of stiffness in the joint peer he has a history of arthritis no history of diabetes thyroid problems or gout. Luke Austin history is positive diabetes and arthritis negative thyroid problems and gout. He has been tested for diabetes.      Past Medical History:  Diagnosis Date  . Asthma   . Hypertension   . Sleep apnea     Past Surgical History:  Procedure Laterality Date  . back surgeries    . LAPAROSCOPIC GASTRIC SLEEVE RESECTION    . TONSILLECTOMY AND ADENOIDECTOMY    . tumor removal off vocal cords      Family History  Problem Relation Age of Onset  . Diabetes Mother   . Anxiety disorder Mother   . Depression Mother   . Diabetes Father   . Hypertension Father   . Arrhythmia Father   . AAA (abdominal aortic aneurysm) Father   . Epilepsy Sister   . Depression Sister   . Anxiety disorder Sister   . Hypertension Sister   . Hypertension Brother   . Skin cancer Sister   . Hypertension Brother    Social History:  reports that he has never smoked. He has never used smokeless tobacco. He reports that he does not drink alcohol or use drugs.  Allergies: No Known Allergies  No medications prior to admission.    No results found for this or any previous visit (from the past 48 hour(s)).  No results found.   Pertinent items are noted in HPI.  Height 6\' 1"  (1.854 m), weight  106.6 kg (235 lb).  General appearance: alert, cooperative and appears stated age Head: Normocephalic, without obvious abnormality Neck: no JVD Resp: clear to auscultation bilaterally Cardio: regular rate and rhythm, S1, S2 normal, no murmur, click, rub or gallop GI: soft, non-tender; bowel sounds normal; no masses,  no organomegaly Extremities: mass right ring finger Pulses: 2+ and symmetric Skin: Skin color, texture, turgor normal. No rashes or lesions Neurologic: Grossly normal Incision/Wound: na  Assessment/Plan Assessment:  1. Mass  2. Osteoarthritis of finger of righthand    Plan: He would like to have this removed. Pre-peri-and postoperative course are discussed along with risk applications. He is aware that there is no guarantee to the surgery possibility of infection recurrence injury to arteries nerves tendons complete relief symptoms possibility of stiffness possibility of recurrence of the cyst would not recommend intervention for the arthritis at the present time.He would like to proceed and is scheduled for excision of a mass PIP joint with debridement PIP joint right ring finger as an outpatient under regional anesthesia.      Luke Austin 11/18/2017, 5:38 AM

## 2017-11-18 NOTE — Transfer of Care (Signed)
Immediate Anesthesia Transfer of Care Note  Patient: Luke Austin  Procedure(s) Performed: RIGHT RING FINGER EXCISION MASS AND DEBRIDEMENT  PROXIMAL INTERPHALANGEAL JOINT (Right Finger)  Patient Location: PACU  Anesthesia Type:Bier block  Level of Consciousness: alert  and patient cooperative  Airway & Oxygen Therapy: Patient Spontanous Breathing and Patient connected to face mask oxygen  Post-op Assessment: Report given to RN and Post -op Vital signs reviewed and stable  Post vital signs: Reviewed and stable  Last Vitals:  Vitals Value Taken Time  BP    Temp    Pulse 60 11/18/2017 10:03 AM  Resp    SpO2 97 % 11/18/2017 10:03 AM  Vitals shown include unvalidated device data.  Last Pain:  Vitals:   11/18/17 0830  TempSrc: Oral  PainSc: 0-No pain         Complications: No apparent anesthesia complications

## 2017-11-18 NOTE — Discharge Instructions (Addendum)

## 2017-11-18 NOTE — Anesthesia Postprocedure Evaluation (Signed)
Anesthesia Post Note  Patient: Luke Austin  Procedure(s) Performed: RIGHT RING FINGER EXCISION MASS AND DEBRIDEMENT  PROXIMAL INTERPHALANGEAL JOINT (Right Finger)     Patient location during evaluation: PACU Anesthesia Type: MAC Level of consciousness: awake and alert Pain management: pain level controlled Vital Signs Assessment: post-procedure vital signs reviewed and stable Respiratory status: spontaneous breathing, nonlabored ventilation and respiratory function stable Cardiovascular status: stable and blood pressure returned to baseline Postop Assessment: no apparent nausea or vomiting Anesthetic complications: no    Last Vitals:  Vitals:   11/18/17 1018 11/18/17 1100  BP: 125/81 134/71  Pulse: (!) 58 61  Resp: 13 16  Temp:  36.6 C  SpO2: 99% 99%    Last Pain:  Vitals:   11/18/17 1100  TempSrc:   PainSc: 0-No pain                 Lynda Rainwater

## 2017-11-18 NOTE — Anesthesia Procedure Notes (Signed)
Anesthesia Regional Block: Bier block (IV Regional)   Pre-Anesthetic Checklist: ,, timeout performed, Correct Patient, Correct Site, Correct Laterality, Correct Procedure, Correct Position, site marked, Risks and benefits discussed,  Surgical consent,  Pre-op evaluation,  At surgeon's request and post-op pain management  Laterality: Right  Prep: chloraprep        Narrative:  Start time: 11/18/2017 9:31 AM End time: 11/18/2017 9:33 AM  Events: blood aspirated,,,,,,,,,,

## 2017-11-18 NOTE — Op Note (Signed)
NAME: Luke Austin RECORD NO: [id number] DATE OF BIRTH: 1960-04-26 FACILITY: [facility] LOCATION: [facility] PHYSICIAN: Wynonia Sours, MD   OPERATIVE REPORT   DATE OF PROCEDURE: 11/18/17    PREOPERATIVE DIAGNOSIS:   Mass proximal phalangeal joint right ring finger   POSTOPERATIVE DIAGNOSIS:   Same   PROCEDURE:   Excision mass ganglion cyst PIP joint right ring finger with synovectomy and removal of osteophytes proximal phalanx right ring finger   SURGEON: Daryll Brod, M.D.   ASSISTANT: none   ANESTHESIA:  Bier block with sedation and Local   INTRAVENOUS FLUIDS:  Per anesthesia flow sheet.   ESTIMATED BLOOD LOSS:  Minimal.   COMPLICATIONS:  None.   SPECIMENS:   Excision ganglion cyst osteophytes proximal phalanx   TOURNIQUET TIME:    Total Tourniquet Time Documented: Forearm (Right) - 0 minutes Total: Forearm (Right) - 0 minutes    DISPOSITION:  Stable to PACU.   INDICATIONS: Patient is a 57 year old male with history of a mass at the PIP joint of his right ring finger.  This has gradually enlarges become mildly painful for him.  This has occurred once before and recurred.  He has a mild degenerative change at the PIP joint proximal phalanx with osteophyte formation.  He is desirous having this removed along with debridement of the joint.  Pre-peri-and postoperative course been discussed along with risk applications.  He is aware there is no guarantee to the surgery the possibility of infection recurrence injury to arteries nerves tendons complete relief symptoms dystrophy the possibility of stiffness to the joint.  In the preoperative area the patient seen extremity marked by both patient and surgeon antibiotic given  OPERATIVE COURSE: Patient brought to the operating room where a forearm-based IV regional anesthetic was carried out without difficulty.  He was prepped using ChloraPrep in the supine position with the right arm free.  A three-minute  dry time was allowed and timeout taken to confirm patient procedure.  A metacarpal block was given with quarter percent bupivacaine without epinephrine 9 cc was used.  A curvilinear incision was made over the PIP joint mass of the right ring finger carried down through subcutaneous tissue.  Bleeders were electrocauterized with bipolar.  The cyst was immediately encountered.  This was extremely large outgoing both dorsally palmarly.  With blunt sharp dissection protecting neurovascular bundles and dorsal sensory nerve the cyst was excised deflation.  A stalk was followed into the PIP joint.  A incision was then made between the central slip lateral band.  Allowed entrance into the joint.  The joint was mildly swollen.  A dorsal synovectomy was performed with hemostatic rondure along with removal of osteophytes from the dorsal aspect of the proximal phalanx.  Specimen was sent to pathology.  The wound was copiously irrigated with saline.  The interval of opening between the lateral bands central central slip was repaired with figure-of-eight 5-0 Mersilene sutures.  Skin was then closed with interrupted 4-0 nylon sutures.  A sterile compressive dressing and splint to the finger applied.  Inflation of the tourniquet remaining fingers pink.  He was taken to the recovery room for observation in satisfactory condition.  He will be discharged home to return [place] in 1 week on tramadol.   Wynonia Sours, MD Electronically signed, 11/18/17

## 2017-11-18 NOTE — Brief Op Note (Signed)
11/18/2017  10:01 AM  PATIENT:  Luke Austin  57 y.o. male  PRE-OPERATIVE DIAGNOSIS:  MASS RIGHT RING FINGER  POST-OPERATIVE DIAGNOSIS:  MASS RIGHT RING FINGER  PROCEDURE:  Procedure(s) with comments: RIGHT RING FINGER EXCISION MASS AND DEBRIDEMENT  PROXIMAL INTERPHALANGEAL JOINT (Right) - right ring finger  SURGEON:  Surgeon(s) and Role:    Daryll Brod, MD - Primary  PHYSICIAN ASSISTANT:   ASSISTANTS: none   ANESTHESIA:   local, regional and IV sedation  EBL:  2 mL   BLOOD ADMINISTERED:none  DRAINS: none   LOCAL MEDICATIONS USED:  BUPIVICAINE   SPECIMEN:  Excision  DISPOSITION OF SPECIMEN:  PATHOLOGY  COUNTS:  YES  TOURNIQUET:   Total Tourniquet Time Documented: Forearm (Right) - 0 minutes Total: Forearm (Right) - 0 minutes   DICTATION: .Dragon Dictation  PLAN OF CARE: Discharge to home after PACU  PATIENT DISPOSITION:  PACU - hemodynamically stable.

## 2017-11-19 ENCOUNTER — Encounter (HOSPITAL_BASED_OUTPATIENT_CLINIC_OR_DEPARTMENT_OTHER): Payer: Self-pay | Admitting: Orthopedic Surgery

## 2017-11-25 DIAGNOSIS — Z125 Encounter for screening for malignant neoplasm of prostate: Secondary | ICD-10-CM | POA: Diagnosis not present

## 2017-11-25 DIAGNOSIS — Z5181 Encounter for therapeutic drug level monitoring: Secondary | ICD-10-CM | POA: Diagnosis not present

## 2017-11-25 DIAGNOSIS — R69 Illness, unspecified: Secondary | ICD-10-CM | POA: Diagnosis not present

## 2017-11-25 DIAGNOSIS — E291 Testicular hypofunction: Secondary | ICD-10-CM | POA: Diagnosis not present

## 2017-11-25 DIAGNOSIS — Z79890 Hormone replacement therapy (postmenopausal): Secondary | ICD-10-CM | POA: Diagnosis not present

## 2017-11-25 DIAGNOSIS — Z6832 Body mass index (BMI) 32.0-32.9, adult: Secondary | ICD-10-CM | POA: Diagnosis not present

## 2017-11-25 DIAGNOSIS — I10 Essential (primary) hypertension: Secondary | ICD-10-CM | POA: Diagnosis not present

## 2017-12-29 ENCOUNTER — Ambulatory Visit: Payer: Medicare HMO | Admitting: Cardiovascular Disease

## 2018-01-27 DIAGNOSIS — Z125 Encounter for screening for malignant neoplasm of prostate: Secondary | ICD-10-CM | POA: Diagnosis not present

## 2018-01-27 DIAGNOSIS — E291 Testicular hypofunction: Secondary | ICD-10-CM | POA: Diagnosis not present

## 2018-02-13 DIAGNOSIS — R69 Illness, unspecified: Secondary | ICD-10-CM | POA: Diagnosis not present

## 2018-02-15 DIAGNOSIS — M47812 Spondylosis without myelopathy or radiculopathy, cervical region: Secondary | ICD-10-CM | POA: Diagnosis not present

## 2018-02-15 DIAGNOSIS — N529 Male erectile dysfunction, unspecified: Secondary | ICD-10-CM | POA: Diagnosis not present

## 2018-02-15 DIAGNOSIS — I10 Essential (primary) hypertension: Secondary | ICD-10-CM | POA: Diagnosis not present

## 2018-02-15 DIAGNOSIS — G4733 Obstructive sleep apnea (adult) (pediatric): Secondary | ICD-10-CM | POA: Diagnosis not present

## 2018-03-16 DIAGNOSIS — G4733 Obstructive sleep apnea (adult) (pediatric): Secondary | ICD-10-CM | POA: Diagnosis not present

## 2018-03-16 DIAGNOSIS — E291 Testicular hypofunction: Secondary | ICD-10-CM | POA: Diagnosis not present

## 2018-03-22 DIAGNOSIS — R69 Illness, unspecified: Secondary | ICD-10-CM | POA: Diagnosis not present

## 2018-05-30 DIAGNOSIS — R69 Illness, unspecified: Secondary | ICD-10-CM | POA: Diagnosis not present

## 2018-10-05 DIAGNOSIS — G4733 Obstructive sleep apnea (adult) (pediatric): Secondary | ICD-10-CM | POA: Diagnosis not present

## 2018-10-05 DIAGNOSIS — I10 Essential (primary) hypertension: Secondary | ICD-10-CM | POA: Diagnosis not present

## 2018-10-05 DIAGNOSIS — Z8249 Family history of ischemic heart disease and other diseases of the circulatory system: Secondary | ICD-10-CM | POA: Diagnosis not present

## 2018-10-05 DIAGNOSIS — Z833 Family history of diabetes mellitus: Secondary | ICD-10-CM | POA: Diagnosis not present

## 2018-10-05 DIAGNOSIS — R69 Illness, unspecified: Secondary | ICD-10-CM | POA: Diagnosis not present

## 2018-10-05 DIAGNOSIS — Z9889 Other specified postprocedural states: Secondary | ICD-10-CM | POA: Diagnosis not present

## 2018-10-05 DIAGNOSIS — R079 Chest pain, unspecified: Secondary | ICD-10-CM | POA: Diagnosis not present

## 2018-10-05 DIAGNOSIS — N529 Male erectile dysfunction, unspecified: Secondary | ICD-10-CM | POA: Diagnosis not present

## 2018-10-05 DIAGNOSIS — J309 Allergic rhinitis, unspecified: Secondary | ICD-10-CM | POA: Diagnosis not present

## 2018-10-05 DIAGNOSIS — H9319 Tinnitus, unspecified ear: Secondary | ICD-10-CM | POA: Diagnosis not present

## 2018-10-05 DIAGNOSIS — Z125 Encounter for screening for malignant neoplasm of prostate: Secondary | ICD-10-CM | POA: Diagnosis not present

## 2018-10-05 DIAGNOSIS — E291 Testicular hypofunction: Secondary | ICD-10-CM | POA: Diagnosis not present

## 2018-11-24 ENCOUNTER — Emergency Department (HOSPITAL_BASED_OUTPATIENT_CLINIC_OR_DEPARTMENT_OTHER): Payer: Medicare HMO

## 2018-11-24 ENCOUNTER — Other Ambulatory Visit: Payer: Self-pay

## 2018-11-24 ENCOUNTER — Emergency Department (HOSPITAL_BASED_OUTPATIENT_CLINIC_OR_DEPARTMENT_OTHER)
Admission: EM | Admit: 2018-11-24 | Discharge: 2018-11-24 | Discharge status: Against Medical Advice | Payer: Medicare HMO | Attending: Emergency Medicine | Admitting: Emergency Medicine

## 2018-11-24 ENCOUNTER — Encounter (HOSPITAL_BASED_OUTPATIENT_CLINIC_OR_DEPARTMENT_OTHER): Payer: Self-pay | Admitting: *Deleted

## 2018-11-24 DIAGNOSIS — S79821A Other specified injuries of right thigh, initial encounter: Secondary | ICD-10-CM | POA: Diagnosis not present

## 2018-11-24 DIAGNOSIS — Y929 Unspecified place or not applicable: Secondary | ICD-10-CM | POA: Diagnosis not present

## 2018-11-24 DIAGNOSIS — Y9389 Activity, other specified: Secondary | ICD-10-CM | POA: Diagnosis not present

## 2018-11-24 DIAGNOSIS — S80851A Superficial foreign body, right lower leg, initial encounter: Secondary | ICD-10-CM | POA: Insufficient documentation

## 2018-11-24 DIAGNOSIS — J45909 Unspecified asthma, uncomplicated: Secondary | ICD-10-CM | POA: Insufficient documentation

## 2018-11-24 DIAGNOSIS — I10 Essential (primary) hypertension: Secondary | ICD-10-CM | POA: Diagnosis not present

## 2018-11-24 DIAGNOSIS — Z79899 Other long term (current) drug therapy: Secondary | ICD-10-CM | POA: Insufficient documentation

## 2018-11-24 DIAGNOSIS — Y999 Unspecified external cause status: Secondary | ICD-10-CM | POA: Insufficient documentation

## 2018-11-24 DIAGNOSIS — W458XXA Other foreign body or object entering through skin, initial encounter: Secondary | ICD-10-CM | POA: Insufficient documentation

## 2018-11-24 NOTE — ED Triage Notes (Signed)
States a needle broke off in his right upper leg while giving himself a Testosterone injection.

## 2018-11-24 NOTE — ED Provider Notes (Signed)
[facility] EMERGENCY DEPARTMENT Provider Note   CSN: [id number] Arrival date & time: 11/24/18  1814    History   Chief Complaint No chief complaint on file.   HPI Bralen Wiltgen is a 58 y.o. male.  Patient presents with concern for needle in right upper leg.  He states that around 4 PM, he was giving himself a testosterone injection which he does every 2 weeks.  He states that when he first injected in his leg, he felt like it was not injecting well, so he removed a injection, then replace it and when he withdrew, there was no needle.  He notes that prior to this, he has been feeling well and in his usual state of health.  He notes that he is able to walk, has no numbness or tingling, does not have any pain at the site at present.   Past Medical History:  Diagnosis Date  . Asthma   . Hypertension   . Sleep apnea     There are no active problems to display for this patient.   Past Surgical History:  Procedure Laterality Date  . back surgeries    . EXCISION MASS UPPER EXTREMETIES Right 11/18/2017   Procedure: RIGHT RING FINGER EXCISION MASS AND DEBRIDEMENT  PROXIMAL INTERPHALANGEAL JOINT;  Surgeon: Daryll Brod, MD;  Location: [facility];  Service: Orthopedics;  Laterality: Right;  right ring finger  . LAPAROSCOPIC GASTRIC SLEEVE RESECTION    . TONSILLECTOMY AND ADENOIDECTOMY    . tumor removal off vocal cords          Home Medications    Prior to Admission medications   Medication Sig Start Date End Date Taking? Authorizing Provider  albuterol (ACCUNEB) 0.63 MG/3ML nebulizer solution Take 1 ampule by nebulization every 6 (six) hours as needed for wheezing.   Yes [provider]  calcium citrate (CALCITRATE - DOSED IN MG ELEMENTAL CALCIUM) 950 MG tablet Take 200 mg of elemental calcium by mouth 2 (two) times daily.   Yes [provider]  cholecalciferol (VITAMIN D) 1000 units tablet Take 1,000 Units by mouth daily.   Yes  [provider]  Ferrous Sulfate (IRON) 325 (65 Fe) MG TABS Take by mouth.   Yes [provider]  gabapentin (NEURONTIN) 300 MG capsule TAKE THREE CAPSULES BY MOUTH THREE TIMES DAILY AS NEEDED FOR PAIN 09/14/17  Yes [provider]  hydrochlorothiazide (HYDRODIURIL) 25 MG tablet TAKE 1 TABLET BY MOUTH ONCE DAILY TAKE WITH 1 GLASS OF OJ OR BANANA 09/29/17  Yes [provider]  metoprolol tartrate (LOPRESSOR) 50 MG tablet TAKE 1 & 1 2 (ONE & ONE HALF) TABLETS BY MOUTH TWICE DAILY 09/29/17  Yes [provider]  Multiple Vitamin (MULTIVITAMIN) tablet Take 1 tablet by mouth daily.   Yes [provider]  telmisartan (MICARDIS) 40 MG tablet TAKE 1 TABLET BY MOUTH ONCE DAILY FOR 30 DAYS 09/29/17  Yes [provider]  tiZANidine (ZANAFLEX) 4 MG tablet Take 4 mg by mouth every 6 (six) hours as needed. 10/05/17  Yes [provider]  traMADol (ULTRAM) 50 MG tablet Take 1 tablet (50 mg total) by mouth every 6 (six) hours as needed. 11/18/17  Yes Daryll Brod, MD  vitamin B-12 (CYANOCOBALAMIN) 100 MCG tablet Take 100 mcg by mouth daily.   Yes [provider]  vitamin C (ASCORBIC ACID) 500 MG tablet Take 500 mg by mouth daily.   Yes [provider]    Family History Family  History  Problem Relation Age of Onset  . Diabetes Mother   . Anxiety disorder Mother   . Depression Mother   . Diabetes Father   . Hypertension Father   . Arrhythmia Father   . AAA (abdominal aortic aneurysm) Father   . Epilepsy Sister   . Depression Sister   . Anxiety disorder Sister   . Hypertension Sister   . Hypertension Brother   . Skin cancer Sister   . Hypertension Brother     Social History Social History   Tobacco Use  . Smoking status: Never Smoker  . Smokeless tobacco: Never Used  Substance Use Topics  . Alcohol use: Never    Frequency: Never  . Drug use: Never     Allergies   Patient has no known allergies.   Review of  Systems Review of Systems  Constitutional: Negative for fever.  HENT: Negative for congestion.   Respiratory: Negative for shortness of breath.   Cardiovascular: Negative for chest pain.  Musculoskeletal: Negative for gait problem.       Pain in right upper lateral leg at site of injection  Skin: Negative for rash.  Neurological: Negative for light-headedness.     Physical Exam Updated Vital Signs BP (!) 145/83   Pulse 69   Temp 98.5 F (36.9 C) (Oral)   Resp 18   Ht 6\' 1"  (1.854 m)   Wt 106.6 kg   SpO2 100%   BMI 31.00 kg/m   Physical Exam Constitutional:      General: He is not in acute distress.    Appearance: Normal appearance.  HENT:     Head: Normocephalic and atraumatic.  Cardiovascular:     Rate and Rhythm: Normal rate and regular rhythm.     Heart sounds: No murmur.  Pulmonary:     Effort: Pulmonary effort is normal. No respiratory distress.     Breath sounds: Normal breath sounds.  Musculoskeletal:        General: Tenderness (at site of injection in right lateral thigh) present. No swelling.     Right lower leg: No edema.     Left lower leg: No edema.  Skin:    Comments: Two small pinpoint lesions on lateral right thigh without surrounding erythema, no needle palpated under the skin  Neurological:     Mental Status: He is alert.        ED Treatments / Results  Labs (all labs ordered are listed, but only abnormal results are displayed) Labs Reviewed - No data to display  EKG None  Radiology Dg Femur Min 2 Views Right  Result Date: 11/24/2018 CLINICAL DATA:  Broken needle while administering thigh injection, initial encounter EXAM: RIGHT FEMUR 2 VIEWS COMPARISON:  None. FINDINGS: No acute fracture or dislocation is noted. There is a vague linear density identified on the frontal view of the femur which is not borne out on any of the remaining images. This is likely artifactual in nature. No definitive radiopaque foreign body is noted. IMPRESSION:  No definitive foreign body seen. No acute bony abnormality is noted. Electronically Signed   By: Inez Catalina M.D.   On: 11/24/2018 19:12    Procedures Procedures (including critical care time)  Medications Ordered in ED Medications - No data to display   Initial Impression / Assessment and Plan / ED Course  I have reviewed the triage vital signs and the nursing notes.  Pertinent labs & imaging results that were available during my care of the patient  were reviewed by me and considered in my medical decision making (see chart for details).  Patient is a 58 year old male who presents with concern for needle in his right lateral thigh after injecting himself with testosterone and pulling back without needle being present on syringe.  Notes some tenderness at the site, otherwise neurovascularly intact and able to ambulate without difficulty.  Will obtain x-ray to assess for location of needle.  X-ray obtained, prior to being able to discuss results with patient, he eloped.  X-ray without definitive foreign body seen.  Unable to give discharge instructions as patient eloped before he could receive them.  Final Clinical Impressions(s) / ED Diagnoses   Final diagnoses:  None    ED Discharge Orders    None       Cleophas Dunker, DO 11/24/18 1930    Isla Pence, MD 12/03/18 1252

## 2018-11-25 DIAGNOSIS — R69 Illness, unspecified: Secondary | ICD-10-CM | POA: Diagnosis not present

## 2018-12-10 DIAGNOSIS — H2510 Age-related nuclear cataract, unspecified eye: Secondary | ICD-10-CM | POA: Diagnosis not present

## 2018-12-10 DIAGNOSIS — H5203 Hypermetropia, bilateral: Secondary | ICD-10-CM | POA: Diagnosis not present

## 2018-12-10 DIAGNOSIS — H5213 Myopia, bilateral: Secondary | ICD-10-CM | POA: Diagnosis not present

## 2018-12-10 DIAGNOSIS — R69 Illness, unspecified: Secondary | ICD-10-CM | POA: Diagnosis not present

## 2018-12-10 DIAGNOSIS — H524 Presbyopia: Secondary | ICD-10-CM | POA: Diagnosis not present

## 2018-12-10 DIAGNOSIS — H52229 Regular astigmatism, unspecified eye: Secondary | ICD-10-CM | POA: Diagnosis not present

## 2018-12-23 DIAGNOSIS — R69 Illness, unspecified: Secondary | ICD-10-CM | POA: Diagnosis not present

## 2019-03-29 DIAGNOSIS — R69 Illness, unspecified: Secondary | ICD-10-CM | POA: Diagnosis not present

## 2019-04-06 IMAGING — NM NM MYOCAR MULTI W/SPECT W/WALL MOTION & EF
2 series · 12 of 12 positions shown · non-contrast
Comparison: none

[Series 1: rest · 6.51mm/px · 6 of 64 frames shown]
[frame 6/64]
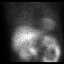
[frame 16/64]
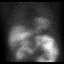
[frame 27/64]
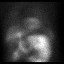
[frame 38/64]
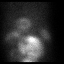
[frame 48/64]
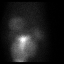
[frame 59/64]
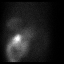

[Series 3: stress gated - perfusion · 6.51mm/px · 6 of 64 frames shown]
[frame 6/64]
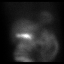
[frame 16/64]
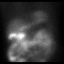
[frame 27/64]
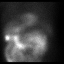
[frame 38/64]
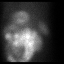
[frame 48/64]
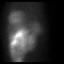
[frame 59/64]
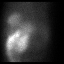

[12 of 12 positions shown; findings below may reference images not displayed]

Canned report from images found in remote index.

Refer to host system for actual result text.

## 2019-04-10 DIAGNOSIS — M25551 Pain in right hip: Secondary | ICD-10-CM | POA: Diagnosis not present

## 2019-04-10 DIAGNOSIS — N390 Urinary tract infection, site not specified: Secondary | ICD-10-CM | POA: Diagnosis not present

## 2019-04-10 DIAGNOSIS — R69 Illness, unspecified: Secondary | ICD-10-CM | POA: Diagnosis not present

## 2019-04-10 DIAGNOSIS — N410 Acute prostatitis: Secondary | ICD-10-CM | POA: Diagnosis not present

## 2019-04-10 DIAGNOSIS — R509 Fever, unspecified: Secondary | ICD-10-CM | POA: Diagnosis not present

## 2019-04-10 DIAGNOSIS — J4540 Moderate persistent asthma, uncomplicated: Secondary | ICD-10-CM | POA: Diagnosis not present

## 2019-04-17 DIAGNOSIS — N410 Acute prostatitis: Secondary | ICD-10-CM | POA: Diagnosis not present

## 2019-04-24 ENCOUNTER — Ambulatory Visit (INDEPENDENT_AMBULATORY_CARE_PROVIDER_SITE_OTHER): Payer: Medicare HMO

## 2019-04-24 ENCOUNTER — Other Ambulatory Visit: Payer: Self-pay

## 2019-04-24 ENCOUNTER — Ambulatory Visit: Payer: Self-pay

## 2019-04-24 ENCOUNTER — Ambulatory Visit (INDEPENDENT_AMBULATORY_CARE_PROVIDER_SITE_OTHER): Payer: Medicare HMO | Admitting: Family Medicine

## 2019-04-24 ENCOUNTER — Encounter: Payer: Self-pay | Admitting: Family Medicine

## 2019-04-24 VITALS — BP 138/82 | HR 73 | Ht 73.0 in | Wt 255.2 lb

## 2019-04-24 DIAGNOSIS — M545 Low back pain, unspecified: Secondary | ICD-10-CM

## 2019-04-24 DIAGNOSIS — M533 Sacrococcygeal disorders, not elsewhere classified: Secondary | ICD-10-CM | POA: Diagnosis not present

## 2019-04-24 DIAGNOSIS — G8929 Other chronic pain: Secondary | ICD-10-CM

## 2019-04-24 NOTE — Progress Notes (Signed)
Subjective:    CC: R lower back/SIJ and R hip  I, Molly Weber, LAT, ATC, am serving as scribe for Dr. Lynne Leader.  HPI: Pt is a 59 y/o male presenting w/ c/o R SIJ/ hip pain x 18 years that is progressively worsening .  Pt has hx of L5 discectomy approximately 4-5 years and a history of 3 total spine surgeries per pt report.  Pt states that sometimes his R hip pain wakes him at night.  Aggravating factors include ADLs and IADLs, walking and transitioning from sit-to-stand.  Pt locates his pain to his R SIJ area and rates his pain currently at a 0/10 but will be a 10/10 at it's worst.  Pt denies any numbness/tingling in his B LEs.  He also denies weakness in his legs except for when the pain is at it's worst.  He denies any recent injury  His surgeries were performed at [facility] in [facility] which fortunately does not use epic EMR and history is now available in epic EMR through care everywhere.  Past medical history, Surgical history, Family history not pertinant except as noted below, Social history, Allergies, and medications have been entered into the medical record, reviewed, and no changes needed.   Review of Systems: No headache, visual changes, nausea, vomiting, diarrhea, constipation, dizziness, abdominal pain, skin rash, fevers, chills, night sweats, weight loss, swollen lymph nodes, body aches, joint swelling, muscle aches, chest pain, shortness of breath, mood changes, visual or auditory hallucinations.   Objective:    Vitals:   04/24/19 1514  BP: 138/82  Pulse: 73  SpO2: 97%   General: Well Developed, well nourished, and in no acute distress.  Neuro/Psych: Alert and oriented x3, extra-ocular muscles intact, able to move all 4 extremities, sensation grossly intact. Skin: Warm and dry, no rashes noted.  Respiratory: Not using accessory muscles, speaking in full sentences, trachea midline.  Cardiovascular: Pulses palpable, no extremity  edema. Abdomen: Does not appear distended. MSK:  L-spine: Multiple mature surgical scars in L-spine.  Otherwise normal-appearing. Nontender at spinal midline. Tender palpation right SI joint nontender otherwise. Range of motion limited extension flexion rotation and lateral flexion. Pain exacerbated with SI joint motion. Low extremity strength are equal normal throughout bilateral lower extremities. Mild antalgic gait.  Lab and Radiology Results No results found for this or any previous visit (from the past 72 hour(s)). DG Lumbar Spine 2-3 Views  Result Date: 04/24/2019 CLINICAL DATA:  Low back pain EXAM: LUMBAR SPINE - 2-3 VIEW COMPARISON:  03/07/2004 FINDINGS: The patient is status post prior posterior fusion from L3-L4. The hardware is intact. There is an interbody spacer at the L3-L4 level. Multilevel degenerative changes are noted throughout the lumbar spine, greatest in the lower lumbar segments. There is no acute displaced fracture or dislocation. IMPRESSION: 1. No acute osseous abnormality. 2. Chronic findings as detailed above. Electronically Signed   By: Constance Holster M.D.   On: 04/24/2019 21:02   DG Pelvis 1-2 Views  Result Date: 04/24/2019 CLINICAL DATA:  Right SI joint pain EXAM: PELVIS - 1-2 VIEW COMPARISON:  None. FINDINGS: The SI joints demonstrate minimal degenerative changes. There are advanced degenerative changes of both femoroacetabular joints, right worse than left. There is a curvilinear lucency coursing through the left femoral head. There is a large amount of stool in the colon. Phleboliths project over the patient's pelvis. IMPRESSION: 1. Curvilinear lucency coursing through the left femoral head. This may represent artifact or a subchondral insufficiency fracture.  2. Advanced degenerative changes of both femoroacetabular joints, right worse than left. 3. Mild degenerative changes of both SI joints. Electronically Signed   By: Constance Holster M.D.   On: 04/24/2019  21:01   I, Lynne Leader, personally (independently) visualized and performed the interpretation of the images attached in this note.  Procedure: Real-time Ultrasound Guided Injection of right SI joint Device: Philips Affiniti 50G Images permanently stored and available for review in the ultrasound unit. Verbal informed consent obtained.  Discussed risks and benefits of procedure. Warned about infection bleeding damage to structures skin hypopigmentation and fat atrophy among others. Patient expresses understanding and agreement Time-out conducted.   Noted no overlying erythema, induration, or other signs of local infection.   Skin prepped in a sterile fashion.   Local anesthesia: Topical Ethyl chloride.   With sterile technique and under real time ultrasound guidance:  40 mg of Kenalog and 3 mL of Marcaine injected easily.   Completed without difficulty   Pain immediately resolved suggesting accurate placement of the medication.   Advised to call if fevers/chills, erythema, induration, drainage, or persistent bleeding.   Images permanently stored and available for review in the ultrasound unit.  Impression: Technically successful ultrasound guided injection.       Impression and Recommendations:    Assessment and Plan: 59 y.o. male with right low back pain.  Pain seems to be most dominant at [facility] joint on the right side.  He obviously has had multiple different surgical procedures and likely has other pain generators in his lumbar spine region. Plan to proceed with diagnostic and therapeutic SI joint injection today.  Based on his immediate excellent pain response to Marcaine I believe the SI joint to be the main pain generator for now. Recheck back in a month.   Recheck sooner if needed.  Next step would could potentially be advanced imaging of lumbar spine for facet joint injection planning.  PDMP reviewed during this encounter. Orders Placed This Encounter  Procedures  . No Chg [place]  Lower Rt    Order Specific Question:   Reason for Exam (SYMPTOM  OR DIAGNOSIS REQUIRED)    Answer:   SI joint pain    Order Specific Question:   Preferred imaging location?    Answer:   [facility]    Order Specific Question:   Release to patient    Answer:   Immediate  . DG Lumbar Spine 2-3 Views    Standing Status:   Future    Number of Occurrences:   1    Standing Expiration Date:   06/21/2020    Order Specific Question:   Reason for Exam (SYMPTOM  OR DIAGNOSIS REQUIRED)    Answer:   eval light low back and SI pain    Order Specific Question:   Preferred imaging location?    Answer:   [facility]    Order Specific Question:   Radiology Contrast Protocol - do NOT remove file path    Answer:   \\[email or web address]s.[email or web address]  . DG Pelvis 1-2 Views    Standing Status:   Future    Number of Occurrences:   1    Standing Expiration Date:   06/21/2020    Order Specific Question:   Reason for Exam (SYMPTOM  OR DIAGNOSIS REQUIRED)    Answer:   right si joint pain    Order Specific Question:   Preferred imaging location?    Answer:   [facility]  Order Specific Question:   Radiology Contrast Protocol - do NOT remove file path    Answer:   \\[email or web address].[email or web address]   No orders of the defined types were placed in this encounter.   Discussed warning signs or symptoms. Please see discharge instructions. Patient expresses understanding.   The above documentation has been reviewed and is accurate and complete Lynne Leader

## 2019-04-24 NOTE — Patient Instructions (Addendum)
Thank you for coming in today. Call or go to the ER if you develop a large red swollen joint with extreme pain or oozing puss.   Recheck in 6 weeks.   Call or go to the ER if you develop a large red swollen joint with extreme pain or oozing puss.

## 2019-04-25 ENCOUNTER — Encounter: Payer: Self-pay | Admitting: Family Medicine

## 2019-04-25 NOTE — Progress Notes (Signed)
Postsurgical changes present showing fusion at L3-4.  Surgical hardware is intact.  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

## 2019-04-25 NOTE — Progress Notes (Signed)
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  The right hip has degenerative changes like you described. There is a possible subchondral insufficiency fracture versus artifact of the way the x-ray was taken.  Because you do not have a lot of groin pain I do not believe this is a fracture however we could work it up further with more imaging if needed.

## 2019-04-28 ENCOUNTER — Telehealth: Payer: Self-pay | Admitting: Family Medicine

## 2019-04-28 NOTE — Telephone Encounter (Signed)
[facility] faxed request for medical records to [facility] 04/28/19  KLM

## 2019-05-04 NOTE — Telephone Encounter (Signed)
[facility] Dept received 34 pages from [place]. Sending interoffice mail  To [facility] Dr. Lynne Leader 05/04/19  Alesia Morin

## 2019-05-16 ENCOUNTER — Encounter: Payer: Self-pay | Admitting: Family Medicine

## 2019-05-16 NOTE — Progress Notes (Signed)
Received medical records from [facility]  Note date of service November 29, 2015: Patient was having back pain and pain located over right SI joint.  At that time he had had right hip intra-articular corticosteroid injection with 0 benefit.  Additionally he had fluoroscopy guided right SI joint injection with only mild to moderate benefit.  At that time he was referred to pain management.  Of note some of these records are linked in care everywhere now.  Documentation will be sent to scan.

## 2019-05-22 DIAGNOSIS — I10 Essential (primary) hypertension: Secondary | ICD-10-CM | POA: Diagnosis not present

## 2019-05-22 DIAGNOSIS — M545 Low back pain: Secondary | ICD-10-CM | POA: Diagnosis not present

## 2019-05-22 DIAGNOSIS — N390 Urinary tract infection, site not specified: Secondary | ICD-10-CM | POA: Diagnosis not present

## 2019-05-22 DIAGNOSIS — G8929 Other chronic pain: Secondary | ICD-10-CM | POA: Diagnosis not present

## 2019-05-24 ENCOUNTER — Ambulatory Visit: Payer: Self-pay

## 2019-05-24 ENCOUNTER — Encounter: Payer: Self-pay | Admitting: Family Medicine

## 2019-05-24 ENCOUNTER — Ambulatory Visit (INDEPENDENT_AMBULATORY_CARE_PROVIDER_SITE_OTHER): Payer: Medicare HMO | Admitting: Family Medicine

## 2019-05-24 ENCOUNTER — Other Ambulatory Visit: Payer: Self-pay

## 2019-05-24 VITALS — BP 160/90 | HR 67 | Ht 73.0 in | Wt 256.8 lb

## 2019-05-24 DIAGNOSIS — G8929 Other chronic pain: Secondary | ICD-10-CM

## 2019-05-24 DIAGNOSIS — M533 Sacrococcygeal disorders, not elsewhere classified: Secondary | ICD-10-CM

## 2019-05-24 NOTE — Progress Notes (Signed)
I, Luke Austin, LAT, ATC, am serving as scribe for Dr. Lynne Leader.  Luke Austin is a 59 y.o. male who presents to [place] at [facility] today for f/u of R lower back/SIJ/hip pain x 18 years.  He was last seen by Dr. Georgina Snell on 04/24/19 and had a R SIJ injection.  Pt has a hx of a L5 disectomy and a hx of 3 total spine surgeries.  Since his last visit, pt reports increased pain in his lower back after doing some work on his walls in his bedroom. He is having increased pain in his R SIJ.  He states that the injection he received at his last visit did help tremendously for about 4-4.5 weeks.  He has been taking his normally prescribed pain medications.     Pertinent review of systems: No fevers or chills  Relevant historical information: Multiple back surgeries as above   Exam:  BP (!) 160/90 (BP Location: Left Arm, Patient Position: Sitting, Cuff Size: Large)   Pulse 67   Ht 6\' 1"  (1.854 m)   Wt 256 lb 12.8 oz (116.5 kg)   SpO2 96%   BMI 33.88 kg/m  General: Well Developed, well nourished, and in no acute distress.   MSK:  L-spine: Nontender to midline.  Tender palpation right SI joint. Decreased lumbar motion. Bilateral lower extremity motion and strength is intact.    Procedure: Real-time Ultrasound Guided Injection of right SI joint Device: Philips Affiniti 50G Images permanently stored and available for review in the ultrasound unit. Verbal informed consent obtained.  Discussed risks and benefits of procedure. Warned about infection bleeding damage to structures skin hypopigmentation and fat atrophy among others. Patient expresses understanding and agreement Time-out conducted.   Noted no overlying erythema, induration, or other signs of local infection.   Skin prepped in a sterile fashion.   Local anesthesia: Topical Ethyl chloride.   With sterile technique and under real time ultrasound guidance:  40 mg of Kenalog and 3 mL of Marcaine injected  easily.   Completed without difficulty   Pain immediately resolved suggesting accurate placement of the medication.   Advised to call if fevers/chills, erythema, induration, drainage, or persistent bleeding.   Images permanently stored and available for review in the ultrasound unit.  Impression: Technically successful ultrasound guided injection.        Assessment and Plan: 59 y.o. male with chronic low back pain.  Patient main pain generator seems to be right SI joint.  Patient had excellent response to pain immediately following injection on January 4 lasting until a few days ago. Repeat injection today also provided immediate pain relief response. Plan to proceed to referral to pain management for plan for SI joint nerve block and ablation.   PDMP not reviewed this encounter. Orders Placed This Encounter  Procedures  . [place] LIMITED JOINT SPACE STRUCTURES LOW RIGHT(NO LINKED CHARGES)    Order Specific Question:   Reason for Exam (SYMPTOM  OR DIAGNOSIS REQUIRED)    Answer:   R SIJ pain    Order Specific Question:   Preferred imaging location?    Answer:   [place]  . Ambulatory referral to Pain Clinic    Referral Priority:   Routine    Referral Type:   Consultation    Referral Reason:   Specialty Services Required    Requested Specialty:   Pain Medicine    Number of Visits Requested:   1   No orders  of the defined types were placed in this encounter.    Discussed warning signs or symptoms. Please see discharge instructions. Patient expresses understanding.   The above documentation has been reviewed and is accurate and complete Lynne Leader

## 2019-05-24 NOTE — Patient Instructions (Addendum)
You had an injection in your R SI joint today.  Call or go to the ER if you develop a large red swollen joint with extreme pain or oozing puss.   You should hear soon about referral to pain management with goal for SI joint ablation.  If you do not hear anything in 1 week let me know.   Plan for SI joint nerve ablation.

## 2019-05-31 ENCOUNTER — Telehealth: Payer: Self-pay

## 2019-05-31 NOTE — Telephone Encounter (Signed)
Patient was told to call back with in a week if he did not hear anything regarding his referral to contact the office.

## 2019-06-01 NOTE — Telephone Encounter (Signed)
Returned pt's call and advised him that Dr. Dianna Limbo office can take up to 4-6 weeks to review his case and determine if he can be seen.  Pt states that he is having difficulty walking and doesn't feel like he can wait that long so feels like he needs a new/different plan.  I inform pt that I will advise Dr. Georgina Snell and call him back w/ more info.  Please advise.

## 2019-06-01 NOTE — Telephone Encounter (Signed)
I could also try using Dr Francesco Runner in [place].  He may be able to get you in sooner.  I do not think you will have to wait 4 - 6 weeks for the people in [place].  Please let me know if you would like me to place a referral to Dr Francesco Runner in [place].   Ellard Artis

## 2019-06-02 NOTE — Telephone Encounter (Signed)
FYI.  Pt has been scheduled to see Dr. Letta Pate on 06/06/19.

## 2019-06-05 ENCOUNTER — Ambulatory Visit: Payer: Medicare HMO | Admitting: Family Medicine

## 2019-06-05 NOTE — Telephone Encounter (Signed)
Will keep [place] as a backup.

## 2019-06-06 ENCOUNTER — Encounter: Payer: Medicare HMO | Admitting: Physical Medicine & Rehabilitation

## 2019-06-06 ENCOUNTER — Other Ambulatory Visit: Payer: Self-pay

## 2019-06-06 ENCOUNTER — Telehealth: Payer: Self-pay | Admitting: Family Medicine

## 2019-06-06 DIAGNOSIS — G8929 Other chronic pain: Secondary | ICD-10-CM

## 2019-06-06 NOTE — Telephone Encounter (Signed)
I received a call from [facility] stating that they are closing out the patient's referral. He came to his appointment and was irate about having to wear a mask and about his copay that was due. He was cussing at the front staff and being very disrespectful. They will not see the patient with this kind of behavior.

## 2019-06-07 NOTE — Telephone Encounter (Signed)
Noted  

## 2019-06-12 NOTE — Telephone Encounter (Signed)
I have referred to Dr. Francesco Runner in [place].  He does the injection ablation that we discussed. You should hear from his office soon.

## 2019-06-12 NOTE — Telephone Encounter (Signed)
Patient called asking if he can be referred to another provider for pain management. He said that he had a bad experience at the front desk and left without being seen.  Please advise.

## 2019-06-12 NOTE — Addendum Note (Signed)
Addended by: Gregor Hams on: 06/12/2019 04:48 PM   Modules accepted: Orders

## 2019-06-13 NOTE — Telephone Encounter (Signed)
Called pt and relayed new pain management referral info.  Pt verbalizes understanding and does not have any additional questions/concerns.

## 2019-06-21 DIAGNOSIS — R61 Generalized hyperhidrosis: Secondary | ICD-10-CM | POA: Diagnosis not present

## 2019-06-21 DIAGNOSIS — H9313 Tinnitus, bilateral: Secondary | ICD-10-CM | POA: Diagnosis not present

## 2019-06-21 DIAGNOSIS — I10 Essential (primary) hypertension: Secondary | ICD-10-CM | POA: Diagnosis not present

## 2019-06-21 DIAGNOSIS — N529 Male erectile dysfunction, unspecified: Secondary | ICD-10-CM | POA: Diagnosis not present

## 2019-06-21 DIAGNOSIS — E291 Testicular hypofunction: Secondary | ICD-10-CM | POA: Diagnosis not present

## 2019-06-27 DIAGNOSIS — M461 Sacroiliitis, not elsewhere classified: Secondary | ICD-10-CM | POA: Diagnosis not present

## 2019-06-27 DIAGNOSIS — M47816 Spondylosis without myelopathy or radiculopathy, lumbar region: Secondary | ICD-10-CM | POA: Diagnosis not present

## 2019-06-29 ENCOUNTER — Other Ambulatory Visit: Payer: Self-pay

## 2019-06-29 ENCOUNTER — Ambulatory Visit: Payer: Medicare HMO | Admitting: Family Medicine

## 2019-06-29 VITALS — BP 140/90 | HR 61 | Ht 73.0 in | Wt 261.4 lb

## 2019-06-29 DIAGNOSIS — G8929 Other chronic pain: Secondary | ICD-10-CM

## 2019-06-29 DIAGNOSIS — E291 Testicular hypofunction: Secondary | ICD-10-CM | POA: Diagnosis not present

## 2019-06-29 DIAGNOSIS — M533 Sacrococcygeal disorders, not elsewhere classified: Secondary | ICD-10-CM

## 2019-06-29 DIAGNOSIS — G894 Chronic pain syndrome: Secondary | ICD-10-CM

## 2019-06-29 DIAGNOSIS — M545 Low back pain: Secondary | ICD-10-CM | POA: Diagnosis not present

## 2019-06-29 NOTE — Patient Instructions (Signed)
Thank you for coming in today. We will expand the referral for Dr Lyla Son.  Please let me know if you need anything else.  I am here for you.

## 2019-06-29 NOTE — Progress Notes (Signed)
   I, Wendy Poet, LAT, ATC, am serving as scribe for Dr. Lynne Leader.  Luke Austin is a 59 y.o. male who presents to [facility] today for of low back pain and SIJ pain x several years.  He last saw Dr. Georgina Snell on 05/24/19 and had a R SIJ injection.  He was referred to pain management and saw Dr. Francesco Runner on 06/27/19 for a B L5, S1, S2 dorsal ramus nerve block.  Since his last visit, pt reports that he has been feeling better after seeing Dr. Francesco Runner on 06/27/19 but states that he would only do the R side and is wanting Dr. Georgina Snell to add the L side to his referral so that the ablation can be done bilaterally.    Diagnostic testing: L-spine and pelvis XR- 04/24/19  Pertinent review of systems: No fevers or chills  Relevant historical information: Chronic pain   Exam:  BP 140/90 (BP Location: Left Arm, Patient Position: Sitting, Cuff Size: Large)   Pulse 61   Ht 6\' 1"  (1.854 m)   Wt 261 lb 6.4 oz (118.6 kg)   SpO2 98%   BMI 34.49 kg/m  General: Well Developed, well nourished, and in no acute distress.   MSK: L-spine: Normal motion normal gait.      Assessment and Plan: 59 y.o. male with bilateral low back and SI joint pain.  Patient had great success with initial ramus branch blocks for right SI joint and low back pain with Dr. Francesco Runner.  He is in the process of being scheduled for ablation for this.  Effectively only needs today as a referral for procedures for left-sided pain.  I have ordered that referral and expanded the referral to include effectively any pain complaint in his body so the Dr. Francesco Runner and Luke Austin can work together to continue to address his chronic pain complaints in his body.  I am happy to see Luke Austin at any time or discussed with him issues in the future.  My door is open   PDMP not reviewed this encounter. Orders Placed This Encounter  Procedures  . Ambulatory referral to Pain Clinic    Referral Priority:   Routine   Referral Type:   Consultation    Referral Reason:   Specialty Services Required    Referred to Provider:   Dorene Ar, MD    Requested Specialty:   Pain Medicine    Number of Visits Requested:   1   No orders of the defined types were placed in this encounter.    Discussed warning signs or symptoms. Please see discharge instructions. Patient expresses understanding.   The above documentation has been reviewed and is accurate and complete Lynne Leader   Total encounter time 15 minutes including charting time date of service. Discussed next steps in treatment plan and backup plan.

## 2019-07-10 DIAGNOSIS — G4733 Obstructive sleep apnea (adult) (pediatric): Secondary | ICD-10-CM | POA: Diagnosis not present

## 2019-07-19 DIAGNOSIS — G4733 Obstructive sleep apnea (adult) (pediatric): Secondary | ICD-10-CM | POA: Diagnosis not present

## 2019-07-19 DIAGNOSIS — I10 Essential (primary) hypertension: Secondary | ICD-10-CM | POA: Diagnosis not present

## 2019-07-20 DIAGNOSIS — M533 Sacrococcygeal disorders, not elsewhere classified: Secondary | ICD-10-CM | POA: Diagnosis not present

## 2019-07-20 DIAGNOSIS — M47816 Spondylosis without myelopathy or radiculopathy, lumbar region: Secondary | ICD-10-CM | POA: Diagnosis not present

## 2019-07-22 DIAGNOSIS — R69 Illness, unspecified: Secondary | ICD-10-CM | POA: Diagnosis not present

## 2019-07-24 DIAGNOSIS — M461 Sacroiliitis, not elsewhere classified: Secondary | ICD-10-CM | POA: Diagnosis not present

## 2019-08-03 DIAGNOSIS — M47816 Spondylosis without myelopathy or radiculopathy, lumbar region: Secondary | ICD-10-CM | POA: Diagnosis not present

## 2019-08-03 DIAGNOSIS — M461 Sacroiliitis, not elsewhere classified: Secondary | ICD-10-CM | POA: Diagnosis not present

## 2019-08-10 DIAGNOSIS — G4733 Obstructive sleep apnea (adult) (pediatric): Secondary | ICD-10-CM | POA: Diagnosis not present

## 2019-08-10 DIAGNOSIS — E291 Testicular hypofunction: Secondary | ICD-10-CM | POA: Diagnosis not present

## 2019-08-10 DIAGNOSIS — I10 Essential (primary) hypertension: Secondary | ICD-10-CM | POA: Diagnosis not present

## 2019-08-10 DIAGNOSIS — Z6833 Body mass index (BMI) 33.0-33.9, adult: Secondary | ICD-10-CM | POA: Diagnosis not present

## 2019-09-07 DIAGNOSIS — M5442 Lumbago with sciatica, left side: Secondary | ICD-10-CM | POA: Diagnosis not present

## 2019-09-07 DIAGNOSIS — E291 Testicular hypofunction: Secondary | ICD-10-CM | POA: Diagnosis not present

## 2019-09-07 DIAGNOSIS — R69 Illness, unspecified: Secondary | ICD-10-CM | POA: Diagnosis not present

## 2019-09-09 DIAGNOSIS — G4733 Obstructive sleep apnea (adult) (pediatric): Secondary | ICD-10-CM | POA: Diagnosis not present

## 2019-09-16 DIAGNOSIS — R69 Illness, unspecified: Secondary | ICD-10-CM | POA: Diagnosis not present

## 2019-09-21 DIAGNOSIS — E291 Testicular hypofunction: Secondary | ICD-10-CM | POA: Diagnosis not present

## 2019-09-21 DIAGNOSIS — Z6833 Body mass index (BMI) 33.0-33.9, adult: Secondary | ICD-10-CM | POA: Diagnosis not present

## 2019-09-21 DIAGNOSIS — G4733 Obstructive sleep apnea (adult) (pediatric): Secondary | ICD-10-CM | POA: Diagnosis not present

## 2019-10-10 DIAGNOSIS — G4733 Obstructive sleep apnea (adult) (pediatric): Secondary | ICD-10-CM | POA: Diagnosis not present

## 2019-10-12 DIAGNOSIS — Z125 Encounter for screening for malignant neoplasm of prostate: Secondary | ICD-10-CM | POA: Diagnosis not present

## 2019-10-12 DIAGNOSIS — Z79899 Other long term (current) drug therapy: Secondary | ICD-10-CM | POA: Diagnosis not present

## 2019-10-12 DIAGNOSIS — I10 Essential (primary) hypertension: Secondary | ICD-10-CM | POA: Diagnosis not present

## 2019-10-16 DIAGNOSIS — M47812 Spondylosis without myelopathy or radiculopathy, cervical region: Secondary | ICD-10-CM | POA: Diagnosis not present

## 2019-10-16 DIAGNOSIS — J4540 Moderate persistent asthma, uncomplicated: Secondary | ICD-10-CM | POA: Diagnosis not present

## 2019-10-16 DIAGNOSIS — E291 Testicular hypofunction: Secondary | ICD-10-CM | POA: Diagnosis not present

## 2019-10-16 DIAGNOSIS — Z1212 Encounter for screening for malignant neoplasm of rectum: Secondary | ICD-10-CM | POA: Diagnosis not present

## 2019-10-16 DIAGNOSIS — I10 Essential (primary) hypertension: Secondary | ICD-10-CM | POA: Diagnosis not present

## 2019-10-16 DIAGNOSIS — M797 Fibromyalgia: Secondary | ICD-10-CM | POA: Diagnosis not present

## 2019-10-16 DIAGNOSIS — Z0000 Encounter for general adult medical examination without abnormal findings: Secondary | ICD-10-CM | POA: Diagnosis not present

## 2019-10-16 DIAGNOSIS — Z23 Encounter for immunization: Secondary | ICD-10-CM | POA: Diagnosis not present

## 2019-10-16 DIAGNOSIS — M5136 Other intervertebral disc degeneration, lumbar region: Secondary | ICD-10-CM | POA: Diagnosis not present

## 2019-10-16 DIAGNOSIS — R079 Chest pain, unspecified: Secondary | ICD-10-CM | POA: Diagnosis not present

## 2019-10-16 DIAGNOSIS — J45909 Unspecified asthma, uncomplicated: Secondary | ICD-10-CM | POA: Diagnosis not present

## 2019-10-18 ENCOUNTER — Other Ambulatory Visit: Payer: Self-pay | Admitting: Internal Medicine

## 2019-10-18 DIAGNOSIS — R7989 Other specified abnormal findings of blood chemistry: Secondary | ICD-10-CM

## 2019-10-24 ENCOUNTER — Other Ambulatory Visit (HOSPITAL_COMMUNITY): Payer: Self-pay | Admitting: Internal Medicine

## 2019-10-24 DIAGNOSIS — R7989 Other specified abnormal findings of blood chemistry: Secondary | ICD-10-CM

## 2019-11-06 ENCOUNTER — Encounter (HOSPITAL_COMMUNITY): Payer: Self-pay

## 2019-11-06 ENCOUNTER — Ambulatory Visit (HOSPITAL_COMMUNITY)
Admission: RE | Admit: 2019-11-06 | Discharge: 2019-11-06 | Disposition: A | Discharge status: Home / Self Care | Payer: Self-pay | Source: Ambulatory Visit | Attending: Internal Medicine | Admitting: Internal Medicine

## 2019-11-06 ENCOUNTER — Ambulatory Visit (HOSPITAL_COMMUNITY): Payer: Medicare HMO

## 2019-11-06 ENCOUNTER — Other Ambulatory Visit: Payer: Self-pay

## 2019-11-06 DIAGNOSIS — E221 Hyperprolactinemia: Secondary | ICD-10-CM | POA: Diagnosis not present

## 2019-11-06 DIAGNOSIS — R7989 Other specified abnormal findings of blood chemistry: Secondary | ICD-10-CM | POA: Insufficient documentation

## 2019-11-06 MED ORDER — GADOBUTROL 1 MMOL/ML IV SOLN
10.0000 mL | Freq: Once | INTRAVENOUS | Status: AC | PRN
  Administered 2019-11-06: 10 mL via INTRAVENOUS

## 2019-11-09 DIAGNOSIS — G4733 Obstructive sleep apnea (adult) (pediatric): Secondary | ICD-10-CM | POA: Diagnosis not present

## 2019-11-10 DIAGNOSIS — G4733 Obstructive sleep apnea (adult) (pediatric): Secondary | ICD-10-CM | POA: Diagnosis not present

## 2019-11-16 DIAGNOSIS — G8929 Other chronic pain: Secondary | ICD-10-CM | POA: Diagnosis not present

## 2019-11-16 DIAGNOSIS — G4733 Obstructive sleep apnea (adult) (pediatric): Secondary | ICD-10-CM | POA: Diagnosis not present

## 2019-11-16 DIAGNOSIS — G4700 Insomnia, unspecified: Secondary | ICD-10-CM | POA: Diagnosis not present

## 2019-11-16 DIAGNOSIS — R519 Headache, unspecified: Secondary | ICD-10-CM | POA: Diagnosis not present

## 2019-11-16 DIAGNOSIS — M5432 Sciatica, left side: Secondary | ICD-10-CM | POA: Diagnosis not present

## 2019-11-16 DIAGNOSIS — R4189 Other symptoms and signs involving cognitive functions and awareness: Secondary | ICD-10-CM | POA: Diagnosis not present

## 2019-11-23 ENCOUNTER — Other Ambulatory Visit: Payer: Medicare HMO

## 2019-11-30 DIAGNOSIS — M5442 Lumbago with sciatica, left side: Secondary | ICD-10-CM | POA: Diagnosis not present

## 2019-11-30 DIAGNOSIS — R519 Headache, unspecified: Secondary | ICD-10-CM | POA: Diagnosis not present

## 2019-11-30 DIAGNOSIS — G8929 Other chronic pain: Secondary | ICD-10-CM | POA: Diagnosis not present

## 2019-12-10 DIAGNOSIS — G4733 Obstructive sleep apnea (adult) (pediatric): Secondary | ICD-10-CM | POA: Diagnosis not present

## 2019-12-11 ENCOUNTER — Encounter: Payer: Self-pay | Admitting: Neurology

## 2019-12-12 ENCOUNTER — Ambulatory Visit: Payer: Medicare HMO | Admitting: Neurology

## 2019-12-12 ENCOUNTER — Encounter: Payer: Self-pay | Admitting: Neurology

## 2019-12-12 ENCOUNTER — Other Ambulatory Visit: Payer: Self-pay

## 2019-12-12 VITALS — BP 129/82 | HR 67 | Ht 73.0 in | Wt 256.0 lb

## 2019-12-12 DIAGNOSIS — G8929 Other chronic pain: Secondary | ICD-10-CM

## 2019-12-12 DIAGNOSIS — M5442 Lumbago with sciatica, left side: Secondary | ICD-10-CM | POA: Diagnosis not present

## 2019-12-12 DIAGNOSIS — R69 Illness, unspecified: Secondary | ICD-10-CM | POA: Diagnosis not present

## 2019-12-12 NOTE — Patient Instructions (Signed)
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
## 2019-12-20 DIAGNOSIS — E291 Testicular hypofunction: Secondary | ICD-10-CM | POA: Diagnosis not present

## 2019-12-20 DIAGNOSIS — I10 Essential (primary) hypertension: Secondary | ICD-10-CM | POA: Diagnosis not present

## 2019-12-27 DIAGNOSIS — Z6833 Body mass index (BMI) 33.0-33.9, adult: Secondary | ICD-10-CM | POA: Diagnosis not present

## 2019-12-27 DIAGNOSIS — R7989 Other specified abnormal findings of blood chemistry: Secondary | ICD-10-CM | POA: Diagnosis not present

## 2019-12-27 DIAGNOSIS — G4733 Obstructive sleep apnea (adult) (pediatric): Secondary | ICD-10-CM | POA: Diagnosis not present

## 2019-12-27 DIAGNOSIS — E291 Testicular hypofunction: Secondary | ICD-10-CM | POA: Diagnosis not present

## 2020-01-03 DIAGNOSIS — I10 Essential (primary) hypertension: Secondary | ICD-10-CM | POA: Diagnosis not present

## 2020-01-03 DIAGNOSIS — Z6834 Body mass index (BMI) 34.0-34.9, adult: Secondary | ICD-10-CM | POA: Diagnosis not present

## 2020-01-10 DIAGNOSIS — G4733 Obstructive sleep apnea (adult) (pediatric): Secondary | ICD-10-CM | POA: Diagnosis not present

## 2020-01-16 DIAGNOSIS — M48061 Spinal stenosis, lumbar region without neurogenic claudication: Secondary | ICD-10-CM | POA: Diagnosis not present

## 2020-01-16 DIAGNOSIS — M5117 Intervertebral disc disorders with radiculopathy, lumbosacral region: Secondary | ICD-10-CM | POA: Diagnosis not present

## 2020-01-16 DIAGNOSIS — M5136 Other intervertebral disc degeneration, lumbar region: Secondary | ICD-10-CM | POA: Diagnosis not present

## 2020-01-23 DIAGNOSIS — M5136 Other intervertebral disc degeneration, lumbar region: Secondary | ICD-10-CM | POA: Diagnosis not present

## 2020-01-23 DIAGNOSIS — M5432 Sciatica, left side: Secondary | ICD-10-CM | POA: Diagnosis not present

## 2020-01-23 DIAGNOSIS — M5416 Radiculopathy, lumbar region: Secondary | ICD-10-CM | POA: Diagnosis not present

## 2020-01-23 DIAGNOSIS — M47812 Spondylosis without myelopathy or radiculopathy, cervical region: Secondary | ICD-10-CM | POA: Diagnosis not present

## 2020-01-30 DIAGNOSIS — M5416 Radiculopathy, lumbar region: Secondary | ICD-10-CM | POA: Diagnosis not present

## 2020-01-30 DIAGNOSIS — M5136 Other intervertebral disc degeneration, lumbar region: Secondary | ICD-10-CM | POA: Diagnosis not present

## 2020-02-02 ENCOUNTER — Ambulatory Visit: Payer: Medicare HMO | Admitting: Diagnostic Neuroimaging

## 2020-02-09 DIAGNOSIS — G4733 Obstructive sleep apnea (adult) (pediatric): Secondary | ICD-10-CM | POA: Diagnosis not present

## 2020-03-11 DIAGNOSIS — G4733 Obstructive sleep apnea (adult) (pediatric): Secondary | ICD-10-CM | POA: Diagnosis not present

## 2020-03-18 DIAGNOSIS — R69 Illness, unspecified: Secondary | ICD-10-CM | POA: Diagnosis not present

## 2020-03-19 ENCOUNTER — Ambulatory Visit: Payer: Medicare HMO | Admitting: Neurology

## 2020-03-19 ENCOUNTER — Encounter: Payer: Self-pay | Admitting: Neurology

## 2020-03-19 VITALS — BP 142/89 | HR 65 | Ht 73.0 in

## 2020-03-19 DIAGNOSIS — Z981 Arthrodesis status: Secondary | ICD-10-CM | POA: Diagnosis not present

## 2020-03-19 DIAGNOSIS — M5481 Occipital neuralgia: Secondary | ICD-10-CM

## 2020-03-19 DIAGNOSIS — M542 Cervicalgia: Secondary | ICD-10-CM | POA: Diagnosis not present

## 2020-03-19 DIAGNOSIS — M5416 Radiculopathy, lumbar region: Secondary | ICD-10-CM | POA: Diagnosis not present

## 2020-03-19 DIAGNOSIS — M5136 Other intervertebral disc degeneration, lumbar region: Secondary | ICD-10-CM | POA: Diagnosis not present
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
## 2020-03-19 NOTE — Progress Notes (Signed)
Dr Dohmeier administered trigger point injections.  0.5% Bupivacaine, lot# [id number], exp 06/2021 Methylprednisolone Acetate, 80 mg/ml, lot# [id number], exp 03/2020

## 2020-03-19 NOTE — Patient Instructions (Signed)
Occipital Neuralgia  Occipital neuralgia is a type of headache that causes brief episodes of very bad pain in the back of your head. Pain from occipital neuralgia may spread (radiate) to other parts of your head. These headaches may be caused by irritation of the nerves that leave your spinal cord high up in your neck, just below the base of your skull (occipital nerves). Your occipital nerves transmit sensations from the back of your head, the top of your head, and the areas behind your ears. What are the causes? This condition can occur without any known cause (primary headache syndrome). In other cases, this condition is caused by pressure on or irritation of one of the two occipital nerves. Pressure and irritation may be due to:  Muscle spasm in the neck.  Neck injury.  Wear and tear of the vertebrae in the neck (osteoarthritis).  Disease of the disks that separate the vertebrae.  Swollen blood vessels that put pressure on the occipital nerves.  Infections.  Tumors.  Diabetes. What are the signs or symptoms? This condition causes brief burning, stabbing, electric, shocking, or shooting pain which can radiate to the top of the head. It can happen on one side or both sides of the head. It can also cause:  Pain behind the eye.  Pain triggered by neck movement or hair brushing.  Scalp tenderness.  Aching in the back of the head between episodes of very bad pain.  Pain gets worse with exposure to bright lights. How is this diagnosed? There is no test that diagnoses this condition. Your health care provider may diagnose this condition based on a physical exam and your symptoms. Other tests may be done, such as:  Imaging studies of the brain and neck (cervical spine), such as an MRI or CT scan. These look for causes of pinched nerves.  Applying pressure to the nerves in the neck to try to re-create the pain.  Injection of numbing medicine into the occipital nerve areas to see if  pain goes away (diagnostic nerve block). How is this treated? Treatment for this condition may begin with simple measures, such as:  Rest.  Massage.  Applying heat or cold on the area.  Over-the-counter pain relievers. If these measures do not work, you may need other treatments, including:  Medicines, such as: ? Prescription-strength anti-inflammatory medicines. ? Muscle relaxants. ? Anti-seizure medicines, which can relieve pain. ? Antidepressants, which can relieve pain. ? Injected medicines, such as medicines that numb the area (local anesthetic) and steroids.  Pulsed radiofrequency ablation. This is when wires are implanted to deliver electrical impulses that block pain signals from the occipital nerve.  Surgery to relieve nerve pressure.  Physical therapy. Follow these instructions at home: Pain management      Avoid any activities that cause pain.  Rest when you have an attack of pain.  Try gentle massage to relieve pain.  Try a different pillow or sleeping position.  If directed, apply heat to the affected area as told by your health care provider. Use the heat source that your health care provider recommends, such as a moist heat pack or a heating pad. ? Place a towel between your skin and the heat source. ? Leave the heat on for 20-30 minutes. ? Remove the heat if your skin turns bright red. This is especially important if you are unable to feel pain, heat, or cold. You may have a greater risk of getting burned.  If directed, apply ice to the   back of the head and neck area as told by your health care provider. ? Put ice in a plastic bag. ? Place a towel between your skin and the bag. ? Leave the ice on for 20 minutes, 2-3 times per day. General instructions  Take over-the-counter and prescription medicines only as told by your health care provider.  Avoid things that make your symptoms worse, such as bright lights.  Try to stay active. Get regular  exercise that does not cause pain. Ask your health care provider to suggest safe exercises for you.  Work with a physical therapist to learn stretching exercises you can do at home.  Practice good posture.  Keep all follow-up visits as told by your health care provider. This is important. Contact a health care provider if:  Your medicine is not working.  You have new or worsening symptoms. Get help right away if:  You have very bad head pain that does not go away.  You have a sudden change in vision, balance, or speech. Summary  Occipital neuralgia is a type of headache that causes brief episodes of very bad pain in the back of your head.  Pain from occipital neuralgia may spread (radiate) to other parts of your head.  Treatment for this condition includes rest, massage, and medicines. This information is not intended to replace advice given to you by your health care provider. Make sure you discuss any questions you have with your health care provider. Document Revised: 03/23/2017 Document Reviewed: 06/11/2016 Elsevier Patient Education  2020 [employer]  

## 2020-03-28 DIAGNOSIS — I10 Essential (primary) hypertension: Secondary | ICD-10-CM | POA: Diagnosis not present

## 2020-03-28 DIAGNOSIS — R29898 Other symptoms and signs involving the musculoskeletal system: Secondary | ICD-10-CM | POA: Diagnosis not present

## 2020-03-28 DIAGNOSIS — Z6835 Body mass index (BMI) 35.0-35.9, adult: Secondary | ICD-10-CM | POA: Diagnosis not present

## 2020-04-01 DIAGNOSIS — R69 Illness, unspecified: Secondary | ICD-10-CM | POA: Diagnosis not present

## 2020-04-10 DIAGNOSIS — G4733 Obstructive sleep apnea (adult) (pediatric): Secondary | ICD-10-CM | POA: Diagnosis not present

## 2020-04-17 ENCOUNTER — Other Ambulatory Visit: Payer: Self-pay | Admitting: Neurological Surgery

## 2020-04-17 DIAGNOSIS — R29898 Other symptoms and signs involving the musculoskeletal system: Secondary | ICD-10-CM

## 2020-04-18 ENCOUNTER — Telehealth: Payer: Self-pay

## 2020-04-18 NOTE — Telephone Encounter (Signed)
Phone call to patient to verify medication list and allergies for myelogram procedure. Pt instructed to hold Tramadol for 48hrs prior to myelogram appointment time and 24 hours after appointment. Pt also instructed to have a driver the day of the procedure, the procedure would take around 2 hours, and discharge instructions discussed. Pt verbalized understanding.  

## 2020-04-30 ENCOUNTER — Other Ambulatory Visit: Payer: Self-pay

## 2020-04-30 ENCOUNTER — Ambulatory Visit
Admission: RE | Admit: 2020-04-30 | Discharge: 2020-04-30 | Disposition: A | Discharge status: Home / Self Care | Payer: Medicare HMO | Source: Ambulatory Visit | Attending: Neurological Surgery | Admitting: Neurological Surgery

## 2020-04-30 VITALS — BP 123/84 | HR 57

## 2020-04-30 DIAGNOSIS — R29898 Other symptoms and signs involving the musculoskeletal system: Secondary | ICD-10-CM

## 2020-04-30 DIAGNOSIS — M4326 Fusion of spine, lumbar region: Secondary | ICD-10-CM | POA: Diagnosis not present

## 2020-04-30 DIAGNOSIS — M5124 Other intervertebral disc displacement, thoracic region: Secondary | ICD-10-CM | POA: Diagnosis not present

## 2020-04-30 DIAGNOSIS — M5136 Other intervertebral disc degeneration, lumbar region: Secondary | ICD-10-CM

## 2020-04-30 DIAGNOSIS — M48061 Spinal stenosis, lumbar region without neurogenic claudication: Secondary | ICD-10-CM | POA: Diagnosis not present

## 2020-04-30 MED ORDER — DIAZEPAM 5 MG PO TABS
10.0000 mg | ORAL_TABLET | Freq: Once | ORAL | Status: AC
  Administered 2020-04-30: 10 mg via ORAL

## 2020-04-30 MED ORDER — ONDANSETRON HCL 4 MG/2ML IJ SOLN
4.0000 mg | Freq: Once | INTRAMUSCULAR | Status: AC
  Administered 2020-04-30: 4 mg via INTRAMUSCULAR

## 2020-04-30 MED ORDER — IOPAMIDOL (ISOVUE-M 300) INJECTION 61%
10.0000 mL | Freq: Once | INTRAMUSCULAR | Status: AC
  Administered 2020-04-30: 10 mL via INTRATHECAL

## 2020-04-30 MED ORDER — MEPERIDINE HCL 100 MG/ML IJ SOLN
100.0000 mg | Freq: Once | INTRAMUSCULAR | Status: AC
Start: 2020-04-30 — End: 2020-04-30
  Administered 2020-04-30: 100 mg via INTRAMUSCULAR

## 2020-04-30 NOTE — Progress Notes (Signed)
Pt reports she has been off of Tramadol for 48 hours and verbalizes understanding not to restart this medication until tomorrow 05/01/20

## 2020-04-30 NOTE — Discharge Instructions (Signed)
Myelogram Discharge Instructions  1. Go home and rest quietly for the next 24 hours.  It is important to lie flat for the next 24 hours.  Get up only to go to the restroom.  You may lie in the bed or on a couch on your back, your stomach, your left side or your right side.  You may have one pillow under your head.  You may have pillows between your knees while you are on your side or under your knees while you are on your back.  2. DO NOT drive today.  Recline the seat as far back as it will go, while still wearing your seat belt, on the way home.  3. You may get up to go to the bathroom as needed.  You may sit up for 10 minutes to eat.  You may resume your normal diet and medications unless otherwise indicated.  Drink plenty of extra fluids today and tomorrow.  4. The incidence of a spinal headache with nausea and/or vomiting is about 5% (one in 20 patients).  If you develop a headache, lie flat and drink plenty of fluids until the headache goes away.  Caffeinated beverages may be helpful.  If you develop severe nausea and vomiting or a headache that does not go away with flat bed rest, call [phone].  5. You may resume normal activities after your 24 hours of bed rest is over; however, do not exert yourself strongly or do any heavy lifting tomorrow.  6. Call your physician for a follow-up appointment.    You may resume Tramadol on Wednesday, May 02, 2019, after 10:30a.m.

## 2020-05-02 ENCOUNTER — Encounter: Payer: Self-pay | Admitting: Neurology

## 2020-05-11 DIAGNOSIS — G4733 Obstructive sleep apnea (adult) (pediatric): Secondary | ICD-10-CM | POA: Diagnosis not present

## 2020-05-16 DIAGNOSIS — I10 Essential (primary) hypertension: Secondary | ICD-10-CM | POA: Diagnosis not present

## 2020-05-16 DIAGNOSIS — Z6835 Body mass index (BMI) 35.0-35.9, adult: Secondary | ICD-10-CM | POA: Diagnosis not present

## 2020-05-16 DIAGNOSIS — M5416 Radiculopathy, lumbar region: Secondary | ICD-10-CM | POA: Diagnosis not present

## 2020-05-30 DIAGNOSIS — M5416 Radiculopathy, lumbar region: Secondary | ICD-10-CM | POA: Diagnosis not present

## 2020-05-30 DIAGNOSIS — E291 Testicular hypofunction: Secondary | ICD-10-CM | POA: Diagnosis not present

## 2020-05-30 DIAGNOSIS — R7989 Other specified abnormal findings of blood chemistry: Secondary | ICD-10-CM | POA: Diagnosis not present

## 2020-06-06 DIAGNOSIS — G4733 Obstructive sleep apnea (adult) (pediatric): Secondary | ICD-10-CM | POA: Diagnosis not present

## 2020-06-06 DIAGNOSIS — E291 Testicular hypofunction: Secondary | ICD-10-CM | POA: Diagnosis not present

## 2020-06-06 DIAGNOSIS — R7989 Other specified abnormal findings of blood chemistry: Secondary | ICD-10-CM | POA: Diagnosis not present

## 2020-06-06 DIAGNOSIS — Z6833 Body mass index (BMI) 33.0-33.9, adult: Secondary | ICD-10-CM | POA: Diagnosis not present

## 2020-06-06 DIAGNOSIS — R7301 Impaired fasting glucose: Secondary | ICD-10-CM | POA: Diagnosis not present

## 2020-06-11 ENCOUNTER — Telehealth: Payer: Self-pay | Admitting: Neurology

## 2020-06-11 DIAGNOSIS — G4733 Obstructive sleep apnea (adult) (pediatric): Secondary | ICD-10-CM | POA: Diagnosis not present

## 2020-06-11 NOTE — Telephone Encounter (Signed)
Pt states the trigger point injection done on 11-30 has worn off, pt asking for a call to discuss other options.

## 2020-06-11 NOTE — Telephone Encounter (Signed)
Called the patient back and was able to offer a earlier appointment for tomorrow with Dr. Brett Fairy.  Patient was very appreciative for the call and accepted the 11 AM appointment.  Patient's last trigger point injection was 03/19/2020 and so he was able to get > 8 weeks of treatment.

## 2020-06-12 ENCOUNTER — Ambulatory Visit (INDEPENDENT_AMBULATORY_CARE_PROVIDER_SITE_OTHER): Payer: Medicare HMO | Admitting: Neurology

## 2020-06-12 ENCOUNTER — Encounter: Payer: Self-pay | Admitting: Neurology

## 2020-06-12 ENCOUNTER — Other Ambulatory Visit: Payer: Self-pay

## 2020-06-12 VITALS — BP 136/82 | HR 62 | Ht 73.0 in | Wt 267.0 lb

## 2020-06-12 DIAGNOSIS — M5481 Occipital neuralgia: Secondary | ICD-10-CM

## 2020-06-12 DIAGNOSIS — M542 Cervicalgia: Secondary | ICD-10-CM | POA: Diagnosis not present

## 2020-06-12 NOTE — Progress Notes (Signed)
Provider:  Larey Seat, MD  Primary Care Physician:  Deland Pretty, [place] [address]     Referring Provider: Deland Pretty, [address] [place],  [place] [address]          Chief Complaint according to patient   Patient presents with:    . New Patient (Initial Visit)           INTERVENTION _  OCCIPITAL NEURALGIA AND CERVICAL PARASPINAL TENSION AND PAIN> :    I have seen Luke Austin. Luke Austin today for a trigger point injection.  The patient is a 60 year old male his overall medications have not changed in the interval.  A previous trigger point injection has relieved him of symptoms of occipital neuralgia and paraspinal pain for several months.  He is here to have hopefully a new reduction in pain.  I chose to inject a 50-50 mixture of equal cane and methylprednisolone.  I injected the patient around the occipital rim but only on the right scalp.  There were 5 injections between the mastoid and the center of the occipital scalp.  I also injected 5 doses into the paraspinal musculature only on the right cervical not lower presenting see 6.  I followed with injections at the top of the shoulder palpating for any nodular changes extreme tension or pain trigger points.  His right side had previously been the less than affected by pain.  Today this is the opposite.  I explained to him that I hope that the injection will at least last for 8 weeks but that he can repeat these injections up to 5 even 6 times a year.  Total injections were 20 and into 6 muscles of the right body side only.  The patient also asked me about testosterone levels and his effect on sleep, he wakes up often drenched in sweat.  Apparently recent follow-up with his endocrinologist has shown that his testosterone levels are really increased this was after he changed from a depot form that was only replaced every 6 months to an injection that he had to  give himself subcutaneously or intramuscularly every 40 days.  Apparently he is now weaning to a lower dose as well as to a longer interval..  I explained to him that his endocrinologist which have to look for reasons while his stimulating hormones may have changed, if there were any medications on board that would have been bound to those hormones, and also to see if he may at all needs testosterone now but he seemed to have had extremely high levels of it.  The patient understood and thanked me for the additional information he was left visit as needed appointment he can call me as he needs for trigger point injection in the future.   Luke Austin is a 60 year- old Caucasian male patient and seen here i on 06/12/2020  His headaches are in the occipital region and nape of the neck.  These headaches are sharp  ( like a knife) and radiating in a loop behind his ears back to the temples. He has a second headache type, a dull non throbbing pain in the crown of the head. Both headaches are always latently there but flare up at different times, every day.           The patient has occipital neuralgia pain- neuralgic , chronic , with sudden exacerbations. He has limited range of motion  for the neck. Especially restricted retroflexion. No vertigo.     If this minor injection is working for at least 8 weeks I would be happy to repeat, if he needs earlier intervention again, would defer to Dr. Jaynee Eagles for BOTOX.         Electronically signed by: Larey Seat, MD 06/12/2020 11:32 AM  [facility] Board certified by The [employer] of Sleep Medicine and Diplomate of the [employer] of Sleep Medicine. Board certified In Neurology through the [place], Fellow of the [employer] of Neurology. Medical Director of [employer].

## 2020-06-12 NOTE — Progress Notes (Signed)
Nerve block w  steroid: Pt signed consent  Yes  0.5% Bupivocaine 5 mL LOT: [id number] EXP: 07/11/2020 NDC: 44034-742-59  Methylprednisolone 80 mg/ml- 37ml DGL:[id number] EXP:08/2021 RJJ:[address]-0

## 2020-06-20 DIAGNOSIS — Z981 Arthrodesis status: Secondary | ICD-10-CM | POA: Diagnosis not present

## 2020-06-20 DIAGNOSIS — Z6835 Body mass index (BMI) 35.0-35.9, adult: Secondary | ICD-10-CM | POA: Diagnosis not present

## 2020-06-20 DIAGNOSIS — I10 Essential (primary) hypertension: Secondary | ICD-10-CM | POA: Diagnosis not present

## 2020-06-27 ENCOUNTER — Ambulatory Visit: Payer: Self-pay | Admitting: Neurology

## 2020-07-02 DIAGNOSIS — Z6835 Body mass index (BMI) 35.0-35.9, adult: Secondary | ICD-10-CM | POA: Diagnosis not present

## 2020-07-02 DIAGNOSIS — M961 Postlaminectomy syndrome, not elsewhere classified: Secondary | ICD-10-CM | POA: Diagnosis not present

## 2020-07-02 DIAGNOSIS — M5416 Radiculopathy, lumbar region: Secondary | ICD-10-CM | POA: Diagnosis not present

## 2020-07-02 DIAGNOSIS — Z981 Arthrodesis status: Secondary | ICD-10-CM | POA: Diagnosis not present

## 2020-07-09 DIAGNOSIS — G4733 Obstructive sleep apnea (adult) (pediatric): Secondary | ICD-10-CM | POA: Diagnosis not present

## 2020-07-16 NOTE — Progress Notes (Signed)
Chief Complaint  Patient presents with  . Follow-up    RM 2 with "emergency contact " (linda) Pt is well, didn't care to ellabertae to me what is going on, wants to see doc.     HISTORY OF PRESENT ILLNESS: 07/17/20 ALL:  Taiquan Campanaro is a 60 y.o. male here today for follow up for occipital neuralgia. He had nerve blocks about 6 weeks ago. Neuropathic pain has completely resolved. Now, he is describing a pressure sensation in the occipital region of head. Can be intense. Can radiate to the top of the head. No sharp stabbing pain. No electrical pain. Tilting his head backwards and resting on pillow helps. He does have neck pain. He is followed by pain management and neurosurgery for chronic back pain, lumbar spinal disease. He is being evaluated for stimulator placement. He is taking gabapentin 661m TID. Also taking tramadol and baclofen as prescribed. These medications do not help headaches. He denies vision changes, light or sound sensitivity, he has history of asthma. No obvious triggers of headaches. He feels wearing a mask makes it more difficult to breathe. He denies depression or anxiety.   HISTORY (copied from Dr Dohmeier's previous note)  Chiefconcernaccording to patient :I have the pleasure of seeing EAdriene Padulatoday,a right -handed White or Caucasian male has a past medical history of Asthma, Hypertension, and Sleep apnea, for which is treated by Dr. PShelia Media and multiple back surgeries, chronic pain syndrome.  The patient presents today for his second visit with me reporting that he experienced since March of this year 2021 headaches and increasing frequency that seem not to be migrainous.  He has no visual aura, no visual impairment and no nausea associated bright lights or sounds do not bother him.  He reports that he also has tinnitus but not just when he has headaches. Tinnitus is perceived in both ears with a constant high-frequency patch, nonpulsatile. He had  undergone the summer some additional blood tests to Dr. HGuinevere Ferrarioffice and there was 1 abnormality noted, he had elevated prolactin levels not strongly elevated but enough to be above the expected range.  This was followed by an MRI of the brain mainly to rule out a prolactinoma.  The MRI of the brain was entirely normal and actually performed with and without contrast.  Prolactin level was 25 when 15 is expected to be the upper range of normal, there is of course always a possibility of a microadenoma but none of this appeared in the MRI.  His headaches are in the occipital region and nape of the neck.  These headaches are sharp  ( like a knife) and radiating in a loop behind his ears back to the temples. He has a second headache type, a dull non throbbing pain in the crown of the head. Both headaches are always latently there but flare up at different times, every day.   He has been in contact with his neurosurgery group- today will be seen at at 9.45 AM- but I had asked him to concentrate his efforts with pain and spine injuries there.  He was once referred [place] pain clinic[place]    He has not been referred to physiatrist. He is managed by Dr PShelia Media    REVIEW OF SYSTEMS: Out of a complete 14 system review of symptoms, the patient complains only of the following symptoms, chronic back pain, headaches, and all other reviewed systems are negative.    ALLERGIES:  No Known Allergies   HOME MEDICATIONS: Outpatient Medications Prior to Visit  Medication Sig Dispense Refill  . albuterol (ACCUNEB) 0.63 MG/3ML nebulizer solution Take 1 ampule by nebulization every 6 (six) hours as needed for wheezing.    Marland Kitchen amLODipine (NORVASC) 5 MG tablet Take 5 mg by mouth daily.    . ARNUITY ELLIPTA 200 MCG/ACT AEPB Take 1 puff by mouth daily.    . Baclofen 5 MG TABS Take 1-2 tablets by mouth 3 (three) times daily as needed.    . calcium citrate (CALCITRATE - DOSED IN MG ELEMENTAL CALCIUM)  950 MG tablet Take 600 mg of elemental calcium by mouth at bedtime.     . cetirizine (ZYRTEC) 10 MG tablet Take 10 mg by mouth daily.    . chlorthalidone (HYGROTON) 50 MG tablet Take 50 mg by mouth every morning.    . cholecalciferol (VITAMIN D) 1000 units tablet Take 1,000 Units by mouth daily.    Marland Kitchen gabapentin (NEURONTIN) 300 MG capsule Take 2 capsules by mouth in the morning and at bedtime.    . Melatonin 10 MG TABS Take 20 mg by mouth at bedtime.    . metoprolol tartrate (LOPRESSOR) 50 MG tablet TAKE 1 & 1 2 (ONE & ONE HALF) TABLETS BY MOUTH TWICE DAILY  3  . Multiple Vitamin (MULTIVITAMIN) tablet Take 1 tablet by mouth daily.    Marland Kitchen telmisartan (MICARDIS) 80 MG tablet Take 80 mg by mouth daily.   3  . Testosterone Cypionate 200 MG/ML KIT Inject 1 mL into the muscle every 14 (fourteen) days.    . traMADol (ULTRAM) 50 MG tablet Take 1 tablet (50 mg total) by mouth every 6 (six) hours as needed. 20 tablet 0  . vitamin B-12 (CYANOCOBALAMIN) 100 MCG tablet Take 100 mcg by mouth daily.    . vitamin C (ASCORBIC ACID) 500 MG tablet Take 500 mg by mouth daily.     No facility-administered medications prior to visit.     PAST MEDICAL HISTORY: Past Medical History:  Diagnosis Date  . Asthma   . Headache   . Hypertension   . Sleep apnea      PAST SURGICAL HISTORY: Past Surgical History:  Procedure Laterality Date  . back surgeries    . EXCISION MASS UPPER EXTREMETIES Right 11/18/2017   Procedure: RIGHT RING FINGER EXCISION MASS AND DEBRIDEMENT  PROXIMAL INTERPHALANGEAL JOINT;  Surgeon: Daryll Brod, MD;  Location: [facility];  Service: Orthopedics;  Laterality: Right;  right ring finger  . LAPAROSCOPIC GASTRIC SLEEVE RESECTION    . NASAL SINUS SURGERY    . TONSILLECTOMY AND ADENOIDECTOMY    . tumor removal off vocal cords       FAMILY HISTORY: Family History  Problem Relation Age of Onset  . Diabetes Mother   . Anxiety disorder Mother   . Depression Mother   .  Diabetes Father   . Hypertension Father   . Arrhythmia Father   . AAA (abdominal aortic aneurysm) Father   . Epilepsy Sister   . Depression Sister   . Anxiety disorder Sister   . Hypertension Sister   . Hypertension Brother   . Skin cancer Sister   . Hypertension Brother      SOCIAL HISTORY: Social History   Socioeconomic History  . Marital status: Married    Spouse name: Not on file  . Number of children: Not on file  . Years of education: Not on file  . Highest education level: Not on  file  Occupational History  . Not on file  Tobacco Use  . Smoking status: Never Smoker  . Smokeless tobacco: Never Used  Vaping Use  . Vaping Use: Never used  Substance and Sexual Activity  . Alcohol use: Never  . Drug use: Never  . Sexual activity: Not on file  Other Topics Concern  . Not on file  Social History Narrative  . Not on file   Social Determinants of Health   Financial Resource Strain: Not on file  Food Insecurity: Not on file  Transportation Needs: Not on file  Physical Activity: Not on file  Stress: Not on file  Social Connections: Not on file  Intimate Partner Violence: Not on file      PHYSICAL EXAM  Vitals:   07/17/20 1122  BP: 130/76  Pulse: 72  Weight: 268 lb (121.6 kg)  Height: $Remove'6\' 1"'MwNiduU$  (1.854 m)   Body mass index is 35.36 kg/m.   Generalized: Well developed, in no acute distress  Cardiology: normal rate and rhythm, no murmur auscultated  Respiratory: clear to auscultation bilaterally    Neurological examination  Mentation: Alert oriented to time, place, history taking. Follows all commands speech and language fluent Cranial nerve II-XII: Pupils were equal round reactive to light. Extraocular movements were full, visual field were full on confrontational test. Facial sensation and strength were normal. Head turning and shoulder shrug  were normal and symmetric. Motor: The motor testing reveals 5 over 5 strength of all 4 extremities. He reports  pain with flexion Good symmetric motor tone is noted throughout.  Sensory: Sensory testing is intact to soft touch on all 4 extremities. No evidence of extinction is noted.  Coordination: Cerebellar testing reveals good finger-nose-finger and heel-to-shin bilaterally.  Gait and station: Gait is normal.  Reflexes: Deep tendon reflexes are symmetric and normal bilaterally.    DIAGNOSTIC DATA (LABS, IMAGING, TESTING) - I reviewed patient records, labs, notes, testing and imaging myself where available.  No results found for: WBC, HGB, HCT, MCV, PLT    Component Value Date/Time   NA 140 11/11/2017 1255   K 4.5 11/11/2017 1255   CL 103 11/11/2017 1255   CO2 30 11/11/2017 1255   GLUCOSE 110 (H) 11/11/2017 1255   BUN 15 11/11/2017 1255   CREATININE 0.86 11/11/2017 1255   CALCIUM 9.6 11/11/2017 1255   GFRNONAA >60 11/11/2017 1255   GFRAA >60 11/11/2017 1255   No results found for: CHOL, HDL, LDLCALC, LDLDIRECT, TRIG, CHOLHDL No results found for: HGBA1C No results found for: VITAMINB12 No results found for: TSH  No flowsheet data found.   No flowsheet data found.   ASSESSMENT AND PLAN  60 y.o. year old male  has a past medical history of Asthma, Headache, Hypertension, and Sleep apnea. here with   Occipital headache  Bilateral occipital neuralgia  Chronic left-sided low back pain with left-sided sciatica  Hezzie returns, today, for follow up of occipital pain. Fortunately, neuropathic pain has resolved with trigger point injections. He continues to have a pressure sensation in the base of neck and occipital region of head. No migrainous symptoms. He is followed by pain management and neurosurgery for chronic back pain. I have directed him to speak with providers regarding neck pain. May consider cervical spine evaluation. I will start topiramate $RemoveBeforeDE'50mg'BCoHIxrgOKSnZXn$  daily at bedtime to see if this helps with tension style pain. Appropriate administration and potential side effects reviewed. He  will continue pain management treatment at the direction of his pain specialist.  He expressed dissatisfaction today that he was asked to wear a mask. He reports getting a respiratory virus after having to wear a mask during his visit with Dr Brett Fairy for nerve blocks 6 weeks ago. He is upset stating that his other providers do not make him wear a mask. I redirected the conversation and educated him on safety standards in our office. He was encouraged to follow up in 3-4 months, sooner if needed. He may call for any worsening and we will work him in as soon as we can but he was advised that we do not always have acute care availability. He was directed to pain management, PCP or urgent care if having acute pain. He and his wife verbalize understanding with this plan.    No orders of the defined types were placed in this encounter.    Meds ordered this encounter  Medications  . topiramate (TOPAMAX) 50 MG tablet    Sig: Take 1 tablet (50 mg total) by mouth daily.    Dispense:  90 tablet    Refill:  3    Order Specific Question:   Supervising Provider    Answer:   Melvenia Beam [id number]      I spent 20 minutes of face-to-face and non-face-to-face time with patient.  This included previsit chart review, lab review, study review, order entry, electronic health record documentation, patient education.    Debbora Presto, MSN, FNP-C 07/17/2020, 12:17 PM  [facility] [address], [place], [place] [address] [phone]

## 2020-07-17 ENCOUNTER — Encounter: Payer: Self-pay | Admitting: Family Medicine

## 2020-07-17 ENCOUNTER — Ambulatory Visit (INDEPENDENT_AMBULATORY_CARE_PROVIDER_SITE_OTHER): Payer: Medicare HMO | Admitting: Family Medicine

## 2020-07-17 VITALS — BP 130/76 | HR 72 | Ht 73.0 in | Wt 268.0 lb

## 2020-07-17 DIAGNOSIS — R519 Headache, unspecified: Secondary | ICD-10-CM

## 2020-07-17 DIAGNOSIS — M5442 Lumbago with sciatica, left side: Secondary | ICD-10-CM | POA: Diagnosis not present

## 2020-07-17 DIAGNOSIS — G8929 Other chronic pain: Secondary | ICD-10-CM | POA: Diagnosis not present

## 2020-07-17 DIAGNOSIS — M5481 Occipital neuralgia: Secondary | ICD-10-CM | POA: Diagnosis not present

## 2020-07-17 MED ORDER — TOPIRAMATE 50 MG PO TABS: 50.0000 mg | ORAL_TABLET | Freq: Every day | ORAL | 3 refills | Status: DC

## 2020-07-17 NOTE — Patient Instructions (Addendum)
Below is our plan:  We will start low dose topiramate 50mg  daily at bedtime for pressure style headaches. Continue close follow up with pain management. 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   Please make sure you are staying well hydrated. I recommend 50-60 ounces daily. Well balanced diet and regular exercise encouraged. Consistent sleep schedule with 6-8 hours recommended.   Please continue follow up with care team as directed.   Follow up with Dr Brett Fairy in 3-4 months, sooner if needed.   You may receive a survey regarding today's visit. I encourage you to leave honest feed back as I do use this information to improve patient care. Thank you for seeing me today!    Topiramate tablets What is this medicine? TOPIRAMATE (toe PYRE a mate) is used to treat seizures in adults or children with epilepsy. It is also used for the prevention of migraine headaches. This medicine may be used for other purposes; ask your health care provider or pharmacist if you have questions. COMMON BRAND NAME(S): Topamax, Topiragen What should I tell my health care provider before I take this medicine? They need to know if you have any of these conditions:  bleeding disorder  kidney disease  lung disease  suicidal thoughts, plans, or attempt  an unusual or allergic reaction to topiramate, other medicines, foods, dyes, or preservatives  pregnant or trying to get pregnant  breast-feeding How should I use this medicine? Take this medicine by mouth with a glass of water. Follow the directions on the prescription label. Do not cut, crush or chew this medicine. Swallow the tablets whole. You can take it with or without food. If it upsets your stomach, take it with food. Take your medicine at regular intervals. Do not take it more often than directed. Do not stop taking except on your doctor's  advice. A special MedGuide will be given to you by the pharmacist with each prescription and refill. Be sure to read this information carefully each time. Talk to your pediatrician regarding the use of this medicine in children. While this drug may be prescribed for children as young as 54 years of age for selected conditions, precautions do apply. Overdosage: If you think you have taken too much of this medicine contact a poison control center or emergency room at once. NOTE: This medicine is only for you. Do not share this medicine with others. What if I miss a dose? If you miss a dose, take it as soon as you can. If your next dose is to be taken in less than 6 hours, then do not take the missed dose. Take the next dose at your regular time. Do not take double or extra doses. What may interact with this medicine? This medicine may interact with the following medications:  acetazolamide  alcohol  antihistamines for allergy, cough, and cold  aspirin and aspirin-like medicines  atropine  birth control pills  certain medicines for anxiety or sleep  certain medicines for bladder problems like oxybutynin, tolterodine  certain medicines for depression like amitriptyline, fluoxetine, sertraline  certain medicines for seizures like carbamazepine, phenobarbital, phenytoin, primidone, valproic acid, zonisamide  certain medicines for stomach problems like dicyclomine, hyoscyamine  certain medicines for travel sickness like scopolamine  certain medicines for Parkinson's disease like benztropine, trihexyphenidyl  certain medicines that treat or prevent blood clots like warfarin, enoxaparin, dalteparin, apixaban, dabigatran, and rivaroxaban  digoxin  general anesthetics  like halothane, isoflurane, methoxyflurane, propofol  hydrochlorothiazide  ipratropium  lithium  medicines that relax muscles for surgery  metformin  narcotic medicines for pain  NSAIDs, medicines for pain and  inflammation, like ibuprofen or naproxen  phenothiazines like chlorpromazine, mesoridazine, prochlorperazine, thioridazine  pioglitazone This list may not describe all possible interactions. Give your health care provider a list of all the medicines, herbs, non-prescription drugs, or dietary supplements you use. Also tell them if you smoke, drink alcohol, or use illegal drugs. Some items may interact with your medicine. What should I watch for while using this medicine? Visit your doctor or health care professional for regular checks on your progress. Tell your health care professional if your symptoms do not start to get better or if they get worse. Do not stop taking except on your health care professional's advice. You may develop a severe reaction. Your health care professional will tell you how much medicine to take. Wear a medical ID bracelet or chain. Carry a card that describes your disease and details of your medicine and dosage times. This medicine can reduce the response of your body to heat or cold. Dress warm in cold weather and stay hydrated in hot weather. If possible, avoid extreme temperatures like saunas, hot tubs, very hot or cold showers, or activities that can cause dehydration such as vigorous exercise. Check with your health care professional if you have severe diarrhea, nausea, and vomiting, or if you sweat a lot. The loss of too much body fluid may make it dangerous for you to take this medicine. You may get drowsy or dizzy. Do not drive, use machinery, or do anything that needs mental alertness until you know how this medicine affects you. Do not stand up or sit up quickly, especially if you are an older patient. This reduces the risk of dizzy or fainting spells. Alcohol may interfere with the effect of this medicine. Avoid alcoholic drinks. Tell your health care professional right away if you have any change in your eyesight. Patients and their families should watch out for  new or worsening depression or thoughts of suicide. Also watch out for sudden changes in feelings such as feeling anxious, agitated, panicky, irritable, hostile, aggressive, impulsive, severely restless, overly excited and hyperactive, or not being able to sleep. If this happens, especially at the beginning of treatment or after a change in dose, call your healthcare professional. This medicine may cause serious skin reactions. They can happen weeks to months after starting the medicine. Contact your health care provider right away if you notice fevers or flu-like symptoms with a rash. The rash may be red or purple and then turn into blisters or peeling of the skin. Or, you might notice a red rash with swelling of the face, lips or lymph nodes in your neck or under your arms. Birth control may not work properly while you are taking this medicine. Talk to your health care professional about using an extra method of birth control. Women should inform their health care professional if they wish to become pregnant or think they might be pregnant. There is a potential for serious side effects and harm to an unborn child. Talk to your health care professional for more information. What side effects may I notice from receiving this medicine? Side effects that you should report to your doctor or health care professional as soon as possible:  allergic reactions like skin rash, itching or hives, swelling of the face, lips, or tongue  blood in  the urine  changes in vision  confusion  loss of memory  pain in lower back or side  pain when urinating  redness, blistering, peeling or loosening of the skin, including inside the mouth  signs and symptoms of bleeding such as bloody or black, tarry stools; red or dark brown urine; spitting up blood or brown material that looks like coffee grounds; red spots on the skin; unusual bruising or bleeding from the eyes, gums, or nose  signs and symptoms of increased  acid in the body like breathing fast; fast heartbeat; headache; confusion; unusually weak or tired; nausea, vomiting  suicidal thoughts, mood changes  trouble speaking or understanding  unusual sweating  unusually weak or tired Side effects that usually do not require medical attention (report to your doctor or health care professional if they continue or are bothersome):  dizziness  drowsiness  fever  loss of appetite  nausea, vomiting  pain, tingling, numbness in the hands or feet  stomach pain  tiredness  upset stomach This list may not describe all possible side effects. Call your doctor for medical advice about side effects. You may report side effects to FDA at 1-800-FDA-1088. Where should I keep my medicine? Keep out of the reach of children and pets. Store between 15 and 30 degrees C (59 and 86 degrees F). Protect from moisture. Keep the container tightly closed. Get rid of any unused medicine after the expiration date. To get rid of medicines that are no longer needed or have expired:  Take the medicine to a medicine take-back program. Check with your pharmacy or law enforcement to find a location.  If you cannot return the medicine, check the label or package insert to see if the medicine should be thrown out in the garbage or flushed down the toilet. If you are not sure, ask your health care provider. If it is safe to put it in the trash, empty the medicine out of the container. Mix the medicine with cat litter, dirt, coffee grounds, or other unwanted substance. Seal the mixture in a bag or container. Put it in the trash. NOTE: This sheet is a summary. It may not cover all possible information. If you have questions about this medicine, talk to your doctor, pharmacist, or health care provider.  2021 Elsevier/Gold Standard (2019-10-19 15:41:57)    Tension Headache, Adult A tension headache is a feeling of pain, pressure, or aching in the head. It is often felt  over the front and sides of the head. Tension headaches can last from 30 minutes to several days. What are the causes? The cause of this condition is not known. Sometimes, tension headaches are brought on by stress, worry (anxiety), or depression. Other things that may set them off include:  Alcohol.  Too much caffeine or caffeine withdrawal.  Colds, flu, or sinus infections.  Dental problems. This can include clenching your teeth.  Being tired.  Holding your head and neck in the same position for a long time, such as while using a computer.  Smoking.  Arthritis in the neck. What are the signs or symptoms?  Feeling pressure around the head.  A dull ache in the head.  Pain over the front and sides of the head.  Feeling sore or tender in the muscles of the head, neck, and shoulders. How is this treated? This condition may be treated with lifestyle changes and with medicines that help relieve symptoms. Follow these instructions at home: Managing pain  Take over-the-counter and  prescription medicines only as told by your doctor.  When you have a headache, lie down in a dark, quiet room.  If told, put ice on your head and neck. To do this: ? Put ice in a plastic bag. ? Place a towel between your skin and the bag. ? Leave the ice on for 20 minutes, 2-3 times a day. ? Take off the ice if your skin turns bright red. This is very important. If you cannot feel pain, heat, or cold, you have a greater risk of damage to the area.  If told, put heat on the back of your neck. Do this as often as told by your doctor. Use the heat source that your doctor recommends, such as a moist heat pack or a heating pad. ? Place a towel between your skin and the heat source. ? Leave the heat on for 20-30 minutes. ? Take off the heat if your skin turns bright red. This is very important. If you cannot feel pain, heat, or cold, you have a greater risk of getting burned. Eating and drinking  Eat  meals on a regular schedule.  If you drink alcohol: ? Limit how much you have to:  0-1 drink a day for women who are not pregnant.  0-2 drinks a day for men. ? Know how much alcohol is in your drink. In the [place] one drink equals one 12 oz bottle of beer (355 mL), one 5 oz glass of wine (148 mL), or one 1 oz glass of hard liquor (44 mL).  Drink enough fluid to keep your pee (urine) pale yellow.  Do not use a lot of caffeine, or stop using caffeine. Lifestyle  Get 7-9 hours of sleep each night. Or get the amount of sleep that your doctor tells you to.  At bedtime, keep computers, phones, and tablets out of your room.  Find ways to lessen your stress. This may include: ? Exercise. ? Deep breathing. ? Yoga. ? Listening to music. ? Thinking positive thoughts.  Sit up straight. Try to relax your muscles.  Do not smoke or use any products that contain nicotine or tobacco. If you need help quitting, ask your doctor. General instructions  Avoid things that can bring on headaches. Keep a headache journal to see what may bring on headaches. For example, write down: ? What you eat and drink. ? How much sleep you get. ? Any change to your diet or medicines.  Keep all follow-up visits.   Contact a doctor if:  Your headache does not get better.  Your headache comes back.  You have a headache, and sounds, light, or smells bother you.  You feel like you may vomit, or you vomit.  Your stomach hurts. Get help right away if:  You all of a sudden get a very bad headache with any of these things: ? A stiff neck. ? Feeling like you may vomit. ? Vomiting. ? Feeling mixed up (confused). ? Feeling weak in one part or one side of your body. ? Having trouble seeing or speaking, or both. ? Feeling short of breath. ? A rash. ? Feeling very sleepy. ? Pain in your eye or ear. ? Trouble walking or balancing. ? Feeling like you will faint, or you faint. Summary  A tension headache is  pain, pressure, or aching in your head.  Tension headaches can last from 30 minutes to several days.  Lifestyle changes and medicines may help relieve pain. This information is not  intended to replace advice given to you by your health care provider. Make sure you discuss any questions you have with your health care provider. Document Revised: 01/04/2020 Document Reviewed: 01/04/2020 Elsevier Patient Education  2021 [employer].

## 2020-08-01 DIAGNOSIS — Z981 Arthrodesis status: Secondary | ICD-10-CM | POA: Diagnosis not present

## 2020-08-01 DIAGNOSIS — M961 Postlaminectomy syndrome, not elsewhere classified: Secondary | ICD-10-CM | POA: Diagnosis not present

## 2020-08-01 DIAGNOSIS — M5416 Radiculopathy, lumbar region: Secondary | ICD-10-CM | POA: Diagnosis not present

## 2020-08-09 DIAGNOSIS — G4733 Obstructive sleep apnea (adult) (pediatric): Secondary | ICD-10-CM | POA: Diagnosis not present

## 2020-08-14 DIAGNOSIS — M5416 Radiculopathy, lumbar region: Secondary | ICD-10-CM | POA: Diagnosis not present

## 2020-08-14 DIAGNOSIS — M961 Postlaminectomy syndrome, not elsewhere classified: Secondary | ICD-10-CM | POA: Diagnosis not present

## 2020-08-14 DIAGNOSIS — Z981 Arthrodesis status: Secondary | ICD-10-CM | POA: Diagnosis not present

## 2020-08-19 DIAGNOSIS — M961 Postlaminectomy syndrome, not elsewhere classified: Secondary | ICD-10-CM | POA: Diagnosis not present

## 2020-08-19 DIAGNOSIS — M5416 Radiculopathy, lumbar region: Secondary | ICD-10-CM | POA: Diagnosis not present

## 2020-09-03 DIAGNOSIS — M546 Pain in thoracic spine: Secondary | ICD-10-CM | POA: Diagnosis not present

## 2020-09-03 DIAGNOSIS — M47814 Spondylosis without myelopathy or radiculopathy, thoracic region: Secondary | ICD-10-CM | POA: Diagnosis not present

## 2020-09-05 DIAGNOSIS — M961 Postlaminectomy syndrome, not elsewhere classified: Secondary | ICD-10-CM | POA: Diagnosis not present

## 2020-09-08 DIAGNOSIS — G4733 Obstructive sleep apnea (adult) (pediatric): Secondary | ICD-10-CM | POA: Diagnosis not present

## 2020-09-18 DIAGNOSIS — Z981 Arthrodesis status: Secondary | ICD-10-CM | POA: Diagnosis not present

## 2020-09-18 DIAGNOSIS — G894 Chronic pain syndrome: Secondary | ICD-10-CM | POA: Diagnosis not present

## 2020-09-18 HISTORY — PX: LAMINECTOMY: SHX219

## 2020-09-18 HISTORY — PX: SPINAL CORD STIMULATOR IMPLANT: SHX2422

## 2020-09-27 DIAGNOSIS — Z981 Arthrodesis status: Secondary | ICD-10-CM | POA: Diagnosis not present

## 2020-09-27 DIAGNOSIS — M961 Postlaminectomy syndrome, not elsewhere classified: Secondary | ICD-10-CM | POA: Diagnosis not present

## 2020-09-27 DIAGNOSIS — G8929 Other chronic pain: Secondary | ICD-10-CM | POA: Diagnosis not present

## 2020-09-27 DIAGNOSIS — M5386 Other specified dorsopathies, lumbar region: Secondary | ICD-10-CM | POA: Diagnosis not present

## 2020-09-27 DIAGNOSIS — Z9889 Other specified postprocedural states: Secondary | ICD-10-CM | POA: Diagnosis not present

## 2020-10-09 DIAGNOSIS — G4733 Obstructive sleep apnea (adult) (pediatric): Secondary | ICD-10-CM | POA: Diagnosis not present

## 2020-10-17 ENCOUNTER — Encounter: Payer: Self-pay | Admitting: Neurology

## 2020-10-17 ENCOUNTER — Ambulatory Visit: Payer: Medicare HMO | Admitting: Neurology

## 2020-10-17 ENCOUNTER — Other Ambulatory Visit: Payer: Self-pay

## 2020-10-17 VITALS — BP 110/71 | HR 69 | Ht 73.0 in

## 2020-10-17 DIAGNOSIS — Z9689 Presence of other specified functional implants: Secondary | ICD-10-CM

## 2020-10-17 DIAGNOSIS — M542 Cervicalgia: Secondary | ICD-10-CM | POA: Diagnosis not present

## 2020-10-17 DIAGNOSIS — R519 Headache, unspecified: Secondary | ICD-10-CM

## 2020-10-17 NOTE — Progress Notes (Signed)
Nerve block with steroid: Pt signed consent- yes  0.5% Bupivocaine 1 mL LOT: [id number] EXP: 09/22 NDC: 41146-431-42  2% Lidocaine 1 mL LOT: [id number] EXP: 11/16/20 NDC: 49611-643-53  Methylprednisolone- 80mg  PNS:[id number] EXP: 02/2022 VIF:[address]-9

## 2020-10-17 NOTE — Progress Notes (Signed)
Provider:  Larey Seat, MD  Primary Care Physician:  Deland Pretty, [address] [place] [place] [address]     Referring Provider: Deland Pretty, [address] [place],  [place] [address]          Chief Complaint according to patient   Patient presents with:     New Patient (Initial Visit)     3 month FU  "spinal cord stimulator 3 weeks ago; 90% off gabapentin, baclofen,tylenol, tramadol"    10-17-2020: Rv for  follow up after pain stimulator implantation, this time feeling off-  the pain is not there anymore- the medications could be reduced, Dr. Ronnald Ramp allowed him to taper slowly, he is down to a fraction of his previous meds. He still his headaches, not occipital now- not as strong , and more like a halo or horseshoe on the head- area of the crown and going to the occiput.  We will change the injection site for trigger-points today towards the temple and glabella, as well as temporal area.  If not successfully in reducing this newer pain component , will need to sent for Botox.    Temporal , parietal nerve block. 12 injections in a crown from one temple to the other, and 2 injections at each side of the occipital nerve's path . Mixture of lidocain, buvacaine and medrol. 33% each.       INTERVENTION  by Larey Seat, MD : OCCIPITAL NEURALGIA AND CERVICAL PARASPINAL TENSION AND PAIN> : 06-12-2020 I have seen Mr. Luke Austin. Eugenio Hoes today for a trigger point injection.  The patient is a 60 year old male his overall medications have not changed in the interval.  A previous trigger point injection has relieved him of symptoms of occipital neuralgia and paraspinal pain for several months.  He is here to have hopefully a new reduction in pain.  I chose to inject a 50-50 mixture of equal cane and methylprednisolone.  I injected the patient around the occipital rim but only on the right scalp.  There were 5 injections between the mastoid and the  center of the occipital scalp.  I also injected 5 doses into the paraspinal musculature only on the right cervical not lower presenting see 6.  I followed with injections at the top of the shoulder palpating for any nodular changes extreme tension or pain trigger points.  His right side had previously been the less than affected by pain.  Today this is the opposite.  I explained to him that I hope that the injection will at least last for 8 weeks but that he can repeat these injections up to 5 even 6 times a year.  Total injections were 20 and into 6 muscles of the right body side only.  The patient also asked me about testosterone levels and his effect on sleep, he wakes up often drenched in sweat.  Apparently recent follow-up with his endocrinologist has shown that his testosterone levels are really increased this was after he changed from a depot form that was only replaced every 6 months to an injection that he had to give himself subcutaneously or intramuscularly every 40 days.  Apparently he is now weaning to a lower dose as well as to a longer interval..  I explained to him that his endocrinologist which have to look for reasons while his stimulating hormones may have changed, if there were any medications on board that would have been bound to those hormones,  and also to see if he may at all needs testosterone now but he seemed to have had extremely high levels of it.  The patient understood and thanked me for the additional information he was left visit as needed appointment he can call me as he needs for trigger point injection in the future.   Luke Austin is a 60 year- old Caucasian male patient and seen here i on 10/17/2020  His headaches are changing in intensity after he has spinal pain stimulator implanted. He has slept poorly, he has withdrawn some what from the medication I think.  It has moved to the crown from in the occipital region and nape of the neck.   These headaches are  sharp  ( like a knife) and radiating in a loop behind his ears back to the temples. He has a second headache type, a dull non throbbing pain in the crown of the head. Both headaches are always latently there but flare up at different times, every day.   He has limited range of motion for the neck. Especially restricted retroflexion. No vertigo.  If this minor injection is working for at least 8 weeks I would be happy to repeat, if he needs earlier intervention again, would defer to Dr. Jaynee Eagles for BOTOX.   Nerve block with steroid: Pt signed consent- yes   0.5% Bupivocaine 1 mL LOT: [id number] EXP: 09/22 NDC: 37943-276-14   2% Lidocaine 1 mL LOT: [id number] EXP: 11/16/20 NDC: 34037-096-43   Methylprednisolone- 80mg  CVK:[id number] EXP: 02/2022 HKG:[address]-2     Electronically signed by: Larey Seat, MD 10/17/2020 10:55 AM  [facility] Board certified by The [employer] of Sleep Medicine and Diplomate of the [employer] of Sleep Medicine. Board certified In Neurology through the [place], Fellow of the [employer] of Neurology. Medical Director of [employer].

## 2020-10-17 NOTE — Patient Instructions (Signed)
Occipital Nerve Block Patient Information  Description: The occipital nerves originate in the cervical (neck) spinal cord and travel upward through muscle and tissue to supply sensation to the back of the head and top of the scalp.  In addition, the nerves control some of the muscles of the scalp.  Occipital neuralgia is an irritation of these nerves which can cause headaches, numbness of the scalp, and neck discomfort.     The occipital nerve block will interrupt nerve transmission through these nerves and can relieve pain and spasm.  The block consists of insertion of a small needle under the skin in the back of the head to deposit local anesthetic (numbing medicine) and/or steroids around the nerve.  The entire block usually lasts less than 5 minutes.  Conditions which may be treated by occipital blocks:  Muscular pain and spasm of the scalp Nerve irritation, back of the head Headaches Upper neck pain  Preparation for the injection:  Do not eat any solid food or dairy products within 8 hours of your appointment. You may drink clear liquids up to 3 hours before appointment.  Clear liquids include water, black coffee, juice or soda.  No milk or cream please. You may take your regular medication, including pain medications, with a sip of water before you appointment.  Diabetics should hold regular insulin (if taken separately) and take 1/2 normal NPH dose the morning of the procedure.  Carry some sugar containing items with you to your appointment. A driver must accompany you and be prepared to drive you home after your procedure. Bring all your current medications with you. An IV may be inserted and sedation may be given at the discretion of the physician. A blood pressure cuff, EKG, and other monitors will often be applied during the procedure.  Some patients may need to have extra oxygen administered for a short period. You will be asked to provide medical information, including your  allergies and medications, prior to the procedure.  We must know immediately if you are taking blood thinners (like Coumadin/Warfarin) or if you are allergic to IV iodine contrast (dye).  We must know if you could possible be pregnant.  Do not wear a high collared shirt or turtleneck.  Tie long hair up in the back if possible.  Possible side-effects:  Bleeding from needle site Infection (rare, may require surgery) Nerve injury (rare) Hair on back of neck can be tinged with iodine scrub (this will wash out) Light-headedness (temporary) Pain at injection site (several days) Decreased blood pressure (rare, temporary) Seizure (very rare)  Call if you experience:  Hives or difficulty breathing ( go to the emergency room) Inflammation or drainage at the injection site(s)  Please note:  Although the local anesthetic injected can often make your painful muscles or headache feel good for several hours after the injection, the pain may return.  It takes 3-7 days for steroids to work.  You may not notice any pain relief for at least one week.  If effective, we will often do a series of injections spaced 3-6 weeks apart to maximally decrease your pain.  If you have any questions, please call [phone] [facility]  

## 2020-10-29 DIAGNOSIS — E291 Testicular hypofunction: Secondary | ICD-10-CM | POA: Diagnosis not present

## 2020-10-29 DIAGNOSIS — I10 Essential (primary) hypertension: Secondary | ICD-10-CM | POA: Diagnosis not present

## 2020-10-29 DIAGNOSIS — Z125 Encounter for screening for malignant neoplasm of prostate: Secondary | ICD-10-CM | POA: Diagnosis not present

## 2020-10-29 DIAGNOSIS — Z833 Family history of diabetes mellitus: Secondary | ICD-10-CM | POA: Diagnosis not present

## 2020-10-31 DIAGNOSIS — R7989 Other specified abnormal findings of blood chemistry: Secondary | ICD-10-CM | POA: Diagnosis not present

## 2020-10-31 DIAGNOSIS — G4733 Obstructive sleep apnea (adult) (pediatric): Secondary | ICD-10-CM | POA: Diagnosis not present

## 2020-10-31 DIAGNOSIS — R7301 Impaired fasting glucose: Secondary | ICD-10-CM | POA: Diagnosis not present

## 2020-10-31 DIAGNOSIS — Z6833 Body mass index (BMI) 33.0-33.9, adult: Secondary | ICD-10-CM | POA: Diagnosis not present

## 2020-10-31 DIAGNOSIS — E291 Testicular hypofunction: Secondary | ICD-10-CM | POA: Diagnosis not present

## 2020-10-31 DIAGNOSIS — R7303 Prediabetes: Secondary | ICD-10-CM | POA: Diagnosis not present

## 2020-11-07 DIAGNOSIS — R69 Illness, unspecified: Secondary | ICD-10-CM | POA: Diagnosis not present

## 2020-11-07 DIAGNOSIS — J309 Allergic rhinitis, unspecified: Secondary | ICD-10-CM | POA: Diagnosis not present

## 2020-11-07 DIAGNOSIS — R7989 Other specified abnormal findings of blood chemistry: Secondary | ICD-10-CM | POA: Diagnosis not present

## 2020-11-07 DIAGNOSIS — Z0001 Encounter for general adult medical examination with abnormal findings: Secondary | ICD-10-CM | POA: Diagnosis not present

## 2020-11-07 DIAGNOSIS — J4540 Moderate persistent asthma, uncomplicated: Secondary | ICD-10-CM | POA: Diagnosis not present

## 2020-11-07 DIAGNOSIS — E118 Type 2 diabetes mellitus with unspecified complications: Secondary | ICD-10-CM | POA: Diagnosis not present

## 2020-11-07 DIAGNOSIS — N529 Male erectile dysfunction, unspecified: Secondary | ICD-10-CM | POA: Diagnosis not present

## 2020-11-07 DIAGNOSIS — I10 Essential (primary) hypertension: Secondary | ICD-10-CM | POA: Diagnosis not present

## 2020-11-07 DIAGNOSIS — E291 Testicular hypofunction: Secondary | ICD-10-CM | POA: Diagnosis not present

## 2020-11-07 DIAGNOSIS — G4733 Obstructive sleep apnea (adult) (pediatric): Secondary | ICD-10-CM | POA: Diagnosis not present

## 2020-11-14 ENCOUNTER — Encounter: Payer: Self-pay | Admitting: Internal Medicine

## 2020-11-21 ENCOUNTER — Telehealth: Payer: Self-pay | Admitting: Neurology

## 2020-11-21 NOTE — Telephone Encounter (Signed)
Called pt back. Offered work in appt on 11/27/20 at 1030am or 130p. He took 1030am. I scheduled pt.

## 2020-11-21 NOTE — Telephone Encounter (Signed)
Called pt back. Advised we do not have any openings for either Dr. Brett Fairy or Amy L,NP to offer at the moment. Recommended he proceed to urgent care for immediate treatment. He declined, stating he did not want to go and "pay high bill and be told to f/u with neurologist". He also ONLY wants to see Dr. Brett Fairy. Advised we will place on wait list and call if she has any cx today or next week. He verbalized understanding.

## 2020-11-21 NOTE — Telephone Encounter (Signed)
Pt called demanding to be scheduled to see the provider due to having a headache for the past two weeks. Pt was informed that the provider does not have something that soon available. Please advise.

## 2020-11-26 DIAGNOSIS — M533 Sacrococcygeal disorders, not elsewhere classified: Secondary | ICD-10-CM | POA: Diagnosis not present

## 2020-11-27 ENCOUNTER — Encounter: Payer: Self-pay | Admitting: Neurology

## 2020-11-27 ENCOUNTER — Other Ambulatory Visit: Payer: Self-pay

## 2020-11-27 ENCOUNTER — Ambulatory Visit: Payer: Medicare HMO | Admitting: Neurology

## 2020-11-27 VITALS — BP 122/84 | HR 63 | Ht 73.0 in | Wt 261.3 lb

## 2020-11-27 DIAGNOSIS — R519 Headache, unspecified: Secondary | ICD-10-CM

## 2020-11-27 DIAGNOSIS — M5481 Occipital neuralgia: Secondary | ICD-10-CM | POA: Diagnosis not present

## 2020-11-27 NOTE — Patient Instructions (Signed)
Occipital Neuralgia  Occipital neuralgia is a type of headache that causes brief episodes of very bad pain in the back of the head. Pain from occipital neuralgia may spread (radiate) to other parts of the head. These headaches may be caused by irritation of the nerves that leave the spinal cord high up in the neck, just below the base of the skull (occipital nerves). The occipital nerves transmit sensations from the back of the head, the topof the head, and the areas behind the ears. What are the causes? This condition can occur without any known cause (primary headache syndrome). In other cases, this condition is caused by pressure on or irritation of one of the two occipital nerves. Pressure and irritation may be due to: Muscle spasm in the neck. Neck injury. Wear and tear of the vertebrae in the neck (osteoarthritis). Disease of the disks that separate the vertebrae. Swollen blood vessels that put pressure on the occipital nerves. Infections. Tumors. Diabetes. What are the signs or symptoms? This condition causes brief burning, stabbing, electric, shocking, or shooting pain in the back of the head that can radiate to the top of the head. It can happen on one side or both sides of the head. It can also cause: Pain behind the eye. Pain triggered by neck movement or hair brushing. Scalp tenderness. Aching in the back of the head between episodes of very bad pain. Pain that gets worse with exposure to bright lights. How is this diagnosed? Your health care provider may diagnose the condition based on a physical exam and your symptoms. Tests may be done, such as: Imaging studies of the brain and neck (cervical spine), such as an MRI or CT scan. These look for causes of pinched nerves. Applying pressure to the nerves in the neck to try to re-create the pain. Injection of numbing medicine into the occipital nerve areas to see if pain goes away (diagnostic nerve block). How is this  treated? Treatment for this condition may begin with simple measures, such as: Rest. Massage. Applying heat or cold to the area. Over-the-counter pain relievers. If these measures do not work, you may need other treatments, including: Medicines, such as: Prescription-strength anti-inflammatory medicines. Muscle relaxants. Anti-seizure medicines, which can relieve pain. Antidepressants, which can relieve pain. Injected medicines, such as medicines that numb the area (local anesthetic) and steroids. Pulsed radiofrequency ablation. This is when wires are implanted to deliver electrical impulses that block pain signals from the occipital nerve. Surgery to relieve nerve pressure. Physical therapy. Follow these instructions at home: Managing pain     Avoid any activities that cause pain. Rest when you have an attack of pain. Try gentle massage to relieve pain. Try a different pillow or sleeping position. If directed, apply heat to the affected area as often as told by your health care provider. Use the heat source that your health care provider recommends, such as a moist heat pack or a heating pad. Place a towel between your skin and the heat source. Leave the heat on for 20-30 minutes. Remove the heat if your skin turns bright red. This is especially important if you are unable to feel pain, heat, or cold. You have a greater risk of getting burned. If directed, put ice on the back of your head and neck area. To do this: Put ice in a plastic bag. Place a towel between your skin and the bag. Leave the ice on for 20 minutes, 2-3 times a day. Remove the ice if  your skin turns bright red. This is very important. If you cannot feel pain, heat, or cold, you have a greater risk of damage to the area. General instructions Take over-the-counter and prescription medicines only as told by your health care provider. Avoid things that make your symptoms worse, such as bright lights. Try to stay  active. Get regular exercise that does not cause pain. Ask your health care provider to suggest safe exercises for you. Work with a physical therapist to learn stretching exercises you can do at home. Practice good posture. Keep all follow-up visits. This is important. Contact a health care provider if: Your medicine is not working. You have new or worsening symptoms. Get help right away if: You have very bad head pain that does not go away. You have a sudden change in vision, balance, or speech. These symptoms may represent a serious problem that is an emergency. Do not wait to see if the symptoms will go away. Get medical help right away. Call your local emergency services (911 in the [place] Do not drive yourself to the hospital. Summary Occipital neuralgia is a type of headache that causes brief episodes of very bad pain in the back of the head. Pain from occipital neuralgia may spread (radiate) to other parts of the head. Treatment for this condition includes rest, massage, and medicines. This information is not intended to replace advice given to you by your health care provider. Make sure you discuss any questions you have with your healthcare provider. Document Revised: 02/04/2020 Document Reviewed: 02/04/2020 Elsevier Patient Education  2022 [employer].

## 2020-11-27 NOTE — Progress Notes (Signed)
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   Rv for  follow up after pain stimulator implantation, this time feeling off-  the pain is not there anymore- the medications could be reduced, Dr. Ronnald Ramp allowed him to taper slowly, he is down to a fraction of his previous meds. He still his headaches, not occipital now- not as strong , and more like a halo or horseshoe on the head- area of the crown and going to the occiput.  We will change the injection site for trigger-points today towards the temple and glabella, as well as temporal area.  If not successfully in reducing this newer pain component , will need to sent for Botox.    Temporal , parietal nerve block. 12 injections in a crown from one temple to the other, and 2 injections at each side of the occipital nerve's path . Mixture of lidocain, buvacaine and medrol. 33% each.       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 Luke Austin today for a trigger point injection.  The patient is a 60 year old male his overall medications have not changed in the interval.  A previous trigger point injection has relieved him of symptoms of occipital neuralgia and paraspinal pain for several months.  He is here to have hopefully a new reduction in pain.  I chose to inject a 50-50 mixture of equal cane and  methylprednisolone.  I injected the patient around the occipital rim but only on the right scalp.  There were 5 injections between the mastoid and the center of the occipital scalp.  I also injected 5 doses into the paraspinal musculature only on the right cervical not lower presenting see 6.  I followed with injections at the top of the shoulder palpating for any nodular changes extreme tension or pain trigger points.  His right side had previously been the less than affected by pain.  Today this is the opposite.  I explained to him that I hope that the injection will at least last for 8 weeks but that he can repeat these injections up to 5 even 6 times a year.  Total injections were 20 and into 6 muscles of the right body side only.  The patient also asked me about testosterone levels and his effect on sleep, he wakes up often drenched in sweat.  Apparently recent follow-up with his endocrinologist has shown that his testosterone levels are really increased this was after he changed from a depot form that was only replaced every 6 months to an injection that he had to give himself subcutaneously or intramuscularly every 40 days.  Apparently he is now weaning to a lower dose as well as to a longer interval..  I explained to him that his endocrinologist  which have to look for reasons while his stimulating hormones may have changed, if there were any medications on board that would have been bound to those hormones, and also to see if he may at all needs testosterone now but he seemed to have had extremely high levels of it.  The patient understood and thanked me for the additional information he was left visit as needed appointment he can call me as he needs for trigger point injection in the future.   Luke Austin is a 60 year- old Caucasian male patient and seen here i on 11/27/2020  His headaches are changing in intensity after he has spinal pain stimulator implanted. He has slept poorly, he has  withdrawn some what from the medication I think.  It has moved to the crown from in the occipital region and nape of the neck.   These headaches are sharp  ( like a knife) and radiating in a loop behind his ears back to the temples. He has a second headache type, a dull non throbbing pain in the crown of the head. Both headaches are always latently there but flare up at different times, every day.   He has limited range of motion for the neck. Especially restricted retroflexion. No vertigo.  If this minor injection is working for at least 8 weeks I would be happy to repeat, if he needs earlier intervention again, would defer to Dr. Jaynee Eagles for BOTOX.   Nerve block with steroid: Pt signed consent- yes   0.5% Bupivocaine 1 mL LOT: [id number] EXP: 09/22 NDC: [id number]     Methylprednisolone- '80mg'$  [id number] EXP: 02/2022 [id number]     Electronically signed by: Larey Seat, MD 11/27/2020 11:24 AM  [facility] Board certified by The [employer] of Sleep Medicine and Diplomate of the [employer] of Sleep Medicine. Board certified In Neurology through the [employer], Fellow of the [employer] of Neurology. Medical Director of [employer].

## 2020-12-03 DIAGNOSIS — E118 Type 2 diabetes mellitus with unspecified complications: Secondary | ICD-10-CM | POA: Diagnosis not present

## 2020-12-19 DIAGNOSIS — G894 Chronic pain syndrome: Secondary | ICD-10-CM | POA: Diagnosis not present

## 2020-12-19 DIAGNOSIS — Z9689 Presence of other specified functional implants: Secondary | ICD-10-CM | POA: Diagnosis not present

## 2020-12-19 DIAGNOSIS — M546 Pain in thoracic spine: Secondary | ICD-10-CM | POA: Diagnosis not present

## 2020-12-20 DIAGNOSIS — M169 Osteoarthritis of hip, unspecified: Secondary | ICD-10-CM | POA: Diagnosis not present

## 2020-12-20 DIAGNOSIS — M5136 Other intervertebral disc degeneration, lumbar region: Secondary | ICD-10-CM | POA: Diagnosis not present

## 2020-12-20 DIAGNOSIS — M545 Low back pain, unspecified: Secondary | ICD-10-CM | POA: Diagnosis not present

## 2020-12-30 ENCOUNTER — Encounter: Payer: Self-pay | Admitting: Neurology

## 2020-12-30 ENCOUNTER — Ambulatory Visit: Payer: Medicare HMO | Admitting: Neurology

## 2020-12-30 VITALS — BP 124/75 | HR 69 | Ht 73.0 in | Wt 257.0 lb

## 2020-12-30 DIAGNOSIS — M5481 Occipital neuralgia: Secondary | ICD-10-CM

## 2020-12-30 DIAGNOSIS — M542 Cervicalgia: Secondary | ICD-10-CM | POA: Diagnosis not present

## 2020-12-30 NOTE — Progress Notes (Signed)
Nerve block  w/o) steroid: Pt signed consent yes   0.5% Bupivocaine 3 mL LOT:  [id number] EXP: O2/2024 NDC: [id number]

## 2020-12-30 NOTE — Patient Instructions (Addendum)
Nerve blocks      Occipital Neuralgia Occipital neuralgia is a type of headache that causes brief episodes of very bad pain in the back of the head. Pain from occipital neuralgia may spread (radiate) to other parts of the head. These headaches may be caused by irritation of the nerves that leave the spinal cord high up in the neck, just below the base of the skull (occipital nerves). The occipital nerves transmit sensations from the back of the head, the top of the head, and the areas behind the ears. What are the causes? This condition can occur without any known cause (primary headache syndrome). In other cases, this condition is caused by pressure on or irritation of one of the two occipital nerves. Pressure and irritation may be due to: Muscle spasm in the neck. Neck injury. Wear and tear of the vertebrae in the neck (osteoarthritis). Disease of the disks that separate the vertebrae. Swollen blood vessels that put pressure on the occipital nerves. Infections. Tumors. Diabetes. What are the signs or symptoms? This condition causes brief burning, stabbing, electric, shocking, or shooting pain in the back of the head that can radiate to the top of the head. It can happen on one side or both sides of the head. It can also cause: Pain behind the eye. Pain triggered by neck movement or hair brushing. Scalp tenderness. Aching in the back of the head between episodes of very bad pain. Pain that gets worse with exposure to bright lights. How is this diagnosed? Your health care provider may diagnose the condition based on a physical exam and your symptoms. Tests may be done, such as: Imaging studies of the brain and neck (cervical spine), such as an MRI or CT scan. These look for causes of pinched nerves. Applying pressure to the nerves in the neck to try to re-create the pain. Injection of numbing medicine into the occipital nerve areas to see if pain goes away (diagnostic nerve block). How  is this treated? Treatment for this condition may begin with simple measures, such as: Rest. Massage. Applying heat or cold to the area. Over-the-counter pain relievers. If these measures do not work, you may need other treatments, including: Medicines, such as: Prescription-strength anti-inflammatory medicines. Muscle relaxants. Anti-seizure medicines, which can relieve pain. Antidepressants, which can relieve pain. Injected medicines, such as medicines that numb the area (local anesthetic) and steroids. Pulsed radiofrequency ablation. This is when wires are implanted to deliver electrical impulses that block pain signals from the occipital nerve. Surgery to relieve nerve pressure. Physical therapy. Follow these instructions at home: Managing pain   Avoid any activities that cause pain. Rest when you have an attack of pain. Try gentle massage to relieve pain. Try a different pillow or sleeping position. If directed, apply heat to the affected area as often as told by your health care provider. Use the heat source that your health care provider recommends, such as a moist heat pack or a heating pad. Place a towel between your skin and the heat source. Leave the heat on for 20-30 minutes. Remove the heat if your skin turns bright red. This is especially important if you are unable to feel pain, heat, or cold. You have a greater risk of getting burned. If directed, put ice on the back of your head and neck area. To do this: Put ice in a plastic bag. Place a towel between your skin and the bag. Leave the ice on for 20 minutes, 2-3 times a  day. Remove the ice if your skin turns bright red. This is very important. If you cannot feel pain, heat, or cold, you have a greater risk of damage to the area. General instructions Take over-the-counter and prescription medicines only as told by your health care provider. Avoid things that make your symptoms worse, such as bright lights. Try to  stay active. Get regular exercise that does not cause pain. Ask your health care provider to suggest safe exercises for you. Work with a physical therapist to learn stretching exercises you can do at home. Practice good posture. Keep all follow-up visits. This is important. Contact a health care provider if: Your medicine is not working. You have new or worsening symptoms. Get help right away if: You have very bad head pain that does not go away. You have a sudden change in vision, balance, or speech. These symptoms may represent a serious problem that is an emergency. Do not wait to see if the symptoms will go away. Get medical help right away. Call your local emergency services (911 in the [place] Do not drive yourself to the hospital. Summary Occipital neuralgia is a type of headache that causes brief episodes of very bad pain in the back of the head. Pain from occipital neuralgia may spread (radiate) to other parts of the head. Treatment for this condition includes rest, massage, and medicines. This information is not intended to replace advice given to you by your health care provider. Make sure you discuss any questions you have with your health care provider. Document Revised: 02/04/2020 Document Reviewed: 02/04/2020 Elsevier Patient Education  2022 [employer].

## 2020-12-30 NOTE — Progress Notes (Signed)
[facility]    Provider:  Dr Jaynee Eagles Requesting Provider: Deland Pretty, MD Primary Care Provider:  Deland Pretty, MD  CC:  cervicogenic headaches/cerviclgia  HPI:  Luke Austin is a 60 y.o. male here as requested by Deland Pretty, MD for headaches. A steady deep deep pain, not throbbing or pulsating or pounding, started 2 years ago without inciting event, just came one day acutely at full forse, different locations can be unilateral or bilateral, prior never really had headaches, there is a migraine history in maternal aunts. Nausea, but he does have phonophobia, rare vomiting, can last 4-12 hours if he takes something, tylenol doesn't help, no medication overuse, no aura, daily headache, moderate to severe for hours, nothing really seems to help. No neck pain but the pain starts at the occipital area of the head, no neck pain or radicular symptoms. No other focal neurologic deficits, associated symptoms, inciting events or modifiable factors.  Reviewed notes, labs and imaging from outside physicians, which showed:  From a thorough review of records, medications tried that can be used in headache or migraine management include: Amlodipine, baclofen, neurontin, metoprolol, ondantron, phenergan, scopolamine, tizamnidine, topiramate, amitriptyline.   Review of Systems: Patient complains of symptoms per HPI as well as the following symptoms headache. Pertinent negatives and positives per HPI. All others negative.   Social History   Socioeconomic History   Marital status: Married    Spouse name: Not on file   Number of children: Not on file   Years of education: Not on file   Highest education level: Not on file  Occupational History   Not on file  Tobacco Use   Smoking status: Never   Smokeless tobacco: Never  Vaping Use   Vaping Use: Never used  Substance and Sexual Activity   Alcohol use: Never   Drug use: Never   Sexual activity: Not on file  Other  Topics Concern   Not on file  Social History Narrative   Not on file   Social Determinants of Health   Financial Resource Strain: Not on file  Food Insecurity: Not on file  Transportation Needs: Not on file  Physical Activity: Not on file  Stress: Not on file  Social Connections: Not on file  Intimate Partner Violence: Not on file    Family History  Problem Relation Age of Onset   Diabetes Mother    Anxiety disorder Mother    Depression Mother    Diabetes Father    Hypertension Father    Arrhythmia Father    AAA (abdominal aortic aneurysm) Father    Epilepsy Sister    Depression Sister    Anxiety disorder Sister    Hypertension Sister    Skin cancer Sister    Hypertension Brother    Hypertension Brother    Migraines Neg Hx     Past Medical History:  Diagnosis Date   Asthma    Headache    Hypertension    Sleep apnea     Patient Active Problem List   Diagnosis Date Noted   S/P insertion of spinal cord stimulator 10/17/2020   Occipital headache 10/17/2020   Trigger point with neck pain 03/19/2020   Bilateral occipital neuralgia 03/19/2020   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   Depression 04/07/2016   OSA (obstructive sleep apnea) 07/25/2015   Dermatofibroma of left lower extremity 06/27/2015   Multiple benign melanocytic nevi 06/27/2015   Elevated ferritin 06/25/2015   Vitamin D deficiency 06/25/2015  Sleep apnea 06/19/2015   Spinal stenosis 05/10/2015   Elevated glucose 04/11/2015   Abnormal chest x-ray 04/04/2015   Chronic lower back pain 12/27/2014   Hypertension 09/14/2014   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   Obesity 09/14/2014   Degeneration of lumbar intervertebral disc 05/19/2013    Past Surgical History:  Procedure Laterality Date   back surgeries     EXCISION MASS UPPER EXTREMETIES Right 11/18/2017   Procedure: RIGHT RING FINGER EXCISION MASS AND DEBRIDEMENT  PROXIMAL INTERPHALANGEAL JOINT;  Surgeon: Daryll Brod, MD;   Location: [facility];  Service: Orthopedics;  Laterality: Right;  right ring finger   LAMINECTOMY  09/2020   T spine   LAPAROSCOPIC GASTRIC SLEEVE RESECTION     NASAL SINUS SURGERY     SPINAL CORD STIMULATOR IMPLANT  09/2020   TONSILLECTOMY AND ADENOIDECTOMY     tumor removal off vocal cords      Current Outpatient Medications  Medication Sig Dispense Refill   albuterol (ACCUNEB) 0.63 MG/3ML nebulizer solution Take 1 ampule by nebulization every 6 (six) hours as needed for wheezing.     amLODipine (NORVASC) 10 MG tablet Take 10 mg by mouth daily.     ARNUITY ELLIPTA 200 MCG/ACT AEPB Take 1 puff by mouth daily.     Baclofen 5 MG TABS Take 1 tablet by mouth. In the morning     calcium citrate (CALCITRATE - DOSED IN MG ELEMENTAL CALCIUM) 950 MG tablet Take 600 mg of elemental calcium by mouth at bedtime.      cetirizine (ZYRTEC) 10 MG tablet Take 10 mg by mouth daily.     cholecalciferol (VITAMIN D) 1000 units tablet Take 1,000 Units by mouth daily.     Melatonin 10 MG TABS Take 20 mg by mouth at bedtime.     metoprolol tartrate (LOPRESSOR) 50 MG tablet TAKE 1 & 1 2 (ONE & ONE HALF) TABLETS BY MOUTH TWICE DAILY  3   Multiple Vitamin (MULTIVITAMIN) tablet Take 1 tablet by mouth daily.     Semaglutide,0.25 or 0.5MG/DOS, (OZEMPIC, 0.25 OR 0.5 MG/DOSE,) 2 MG/1.5ML SOPN Inject 0.5 mg into the skin daily.     telmisartan (MICARDIS) 80 MG tablet Take 80 mg by mouth daily.   3   Testosterone Cypionate 200 MG/ML KIT Inject 1 mL into the muscle every 14 (fourteen) days.     topiramate (TOPAMAX) 50 MG tablet Take 1 tablet (50 mg total) by mouth daily. 90 tablet 3   traMADol (ULTRAM) 50 MG tablet Take 1 tablet (50 mg total) by mouth every 6 (six) hours as needed. (Patient taking differently: Take 50 mg by mouth. 1 table in the morning) 20 tablet 0   vitamin B-12 (CYANOCOBALAMIN) 100 MCG tablet Take 100 mcg by mouth daily.     vitamin C (ASCORBIC ACID) 500 MG tablet Take 500 mg by mouth  daily.     No current facility-administered medications for this visit.    Allergies as of 12/30/2020   (No Known Allergies)    Vitals: BP 124/75   Pulse 69   Ht 6' 1"  (1.854 m)   Wt 257 lb (116.6 kg)   BMI 33.91 kg/m  Last Weight:  Wt Readings from Last 1 Encounters:  12/30/20 257 lb (116.6 kg)   Last Height:   Ht Readings from Last 1 Encounters:  12/30/20 6' 1"  (1.854 m)   Exam: NAD, pleasant                  Speech:  Speech is normal; fluent and spontaneous with normal comprehension.  Cognition:    The patient is oriented to person, place, and time;     recent and remote memory intact;     language fluent;    Cranial Nerves:    The pupils are equal, round, and reactive to light.Trigeminal sensation is intact and the muscles of mastication are normal. The face is symmetric. The palate elevates in the midline. Hearing intact. Voice is normal. Shoulder shrug is normal. The tongue has normal motion without fasciculations.   Coordination:  No dysmetria  Motor Observation:    No asymmetry, no atrophy, and no involuntary movements noted. Tone:    Normal muscle tone.     Strength:    Strength is V/V in the upper and lower limbs.      Sensation: intact to LT     Assessment/Plan:  60 year old male with cervicalgia/occipital neuralgia  Neuro exam in non focal. Pain is bilateral and is located in the distribution of the greater, lesser and/or third occipital nerves, sore and continuous, with tenderness and trigger points at the emergence of the greater occipital nerve.   - Physical therapy - He declines but recommend  manual therapy, stretching, heating, dry needling, strength exercises or any modality per evaluation. - MRI of the cervical spine - he declines, also has hardwain (spinal stimulator) - Epidural Steroid INjections or medial branch blocks for neck pain and occipital neuralgia- back to [place] Baclofen prn  - Nerve blocks today very successful:  consider RFA in the future - He declines botox which I discussed is more indicated for migraines and doesn't usually work for occipital neuralgia even though he does have some migrainous features - Nerve blocks today   Orders Placed This Encounter  Procedures   Ambulatory referral to Pain Clinic   All procedures a documented below were medically necessary, reasonable and appropriate based on the patient's history, medical diagnosis and physician opinion. Verbal informed consent was obtained from the patient, patient was informed of potential risk of procedure, including bruising, bleeding, hematoma formation, infection, muscle weakness, muscle pain, numbness, transient hypertension, transient hyperglycemia and transient insomnia among others. All areas injected were topically clean with isopropyl rubbing alcohol. Nonsterile nonlatex gloves were worn during the procedure.  1. Greater occipital nerve block [id number]). The greater occipital nerve site was identified at the nuchal line medial to the occipital artery. Medication was injected into the left and right occipital nerve areas and suboccipital areas. Patient's condition is associated with inflammation of the greater occipital nerve and associated multiple groups. Injection was deemed medically necessary, reasonable and appropriate. Injection represents a separate and unique surgical service.      Cc: Deland Pretty, MD,  Deland Pretty, MD  Sarina Ill, MD  [facility] [address] [place], [place] [address]  Phone [phone] Fax [phone]  I spent 30 minutes of face-to-face and non-face-to-face time with patient on the  1. Bilateral occipital neuralgia   2. Cervico-occipital neuralgia   3. Cervicalgia    diagnosis.  This included previsit chart review, lab review, study review, order entry, electronic health record documentation, patient education on the different diagnostic and therapeutic  options, counseling and coordination of care, risks and benefits of management, compliance, or risk factor reduction. This des not include time spent on occipital nerve blocks.

## 2020-12-31 ENCOUNTER — Telehealth: Payer: Self-pay | Admitting: Neurology

## 2020-12-31 NOTE — Telephone Encounter (Signed)
Referral sent to [facility]/Dr. Brien Few for pain management. Phone: [phone].

## 2021-01-07 DIAGNOSIS — M160 Bilateral primary osteoarthritis of hip: Secondary | ICD-10-CM | POA: Diagnosis not present

## 2021-01-09 DIAGNOSIS — E118 Type 2 diabetes mellitus with unspecified complications: Secondary | ICD-10-CM | POA: Diagnosis not present

## 2021-01-09 DIAGNOSIS — R350 Frequency of micturition: Secondary | ICD-10-CM | POA: Diagnosis not present

## 2021-01-09 DIAGNOSIS — I10 Essential (primary) hypertension: Secondary | ICD-10-CM | POA: Diagnosis not present

## 2021-01-09 DIAGNOSIS — M25551 Pain in right hip: Secondary | ICD-10-CM | POA: Diagnosis not present

## 2021-01-09 DIAGNOSIS — Z01818 Encounter for other preprocedural examination: Secondary | ICD-10-CM | POA: Diagnosis not present

## 2021-01-23 ENCOUNTER — Ambulatory Visit: Payer: Medicare HMO | Admitting: Neurology

## 2021-01-23 DIAGNOSIS — Z5181 Encounter for therapeutic drug level monitoring: Secondary | ICD-10-CM | POA: Diagnosis not present

## 2021-01-23 DIAGNOSIS — M1612 Unilateral primary osteoarthritis, left hip: Secondary | ICD-10-CM | POA: Diagnosis not present

## 2021-01-23 DIAGNOSIS — Z79899 Other long term (current) drug therapy: Secondary | ICD-10-CM | POA: Diagnosis not present

## 2021-01-28 DIAGNOSIS — M25552 Pain in left hip: Secondary | ICD-10-CM | POA: Diagnosis not present

## 2021-01-28 DIAGNOSIS — M25551 Pain in right hip: Secondary | ICD-10-CM | POA: Diagnosis not present

## 2021-01-28 DIAGNOSIS — Z9689 Presence of other specified functional implants: Secondary | ICD-10-CM | POA: Diagnosis not present

## 2021-01-28 DIAGNOSIS — I10 Essential (primary) hypertension: Secondary | ICD-10-CM | POA: Diagnosis not present

## 2021-01-28 DIAGNOSIS — Z6833 Body mass index (BMI) 33.0-33.9, adult: Secondary | ICD-10-CM | POA: Diagnosis not present

## 2021-01-28 DIAGNOSIS — M5416 Radiculopathy, lumbar region: Secondary | ICD-10-CM | POA: Diagnosis not present

## 2021-02-07 DIAGNOSIS — M1612 Unilateral primary osteoarthritis, left hip: Secondary | ICD-10-CM | POA: Diagnosis not present

## 2021-02-09 DIAGNOSIS — Z96642 Presence of left artificial hip joint: Secondary | ICD-10-CM | POA: Diagnosis not present

## 2021-02-18 HISTORY — PX: TOTAL HIP ARTHROPLASTY: SHX124

## 2021-02-21 DIAGNOSIS — M1611 Unilateral primary osteoarthritis, right hip: Secondary | ICD-10-CM | POA: Diagnosis not present

## 2021-02-21 DIAGNOSIS — Z471 Aftercare following joint replacement surgery: Secondary | ICD-10-CM | POA: Diagnosis not present

## 2021-02-21 DIAGNOSIS — Z96642 Presence of left artificial hip joint: Secondary | ICD-10-CM | POA: Diagnosis not present

## 2021-03-06 DIAGNOSIS — Z5320 Procedure and treatment not carried out because of patient's decision for unspecified reasons: Secondary | ICD-10-CM | POA: Diagnosis not present

## 2021-03-06 DIAGNOSIS — I10 Essential (primary) hypertension: Secondary | ICD-10-CM | POA: Diagnosis not present

## 2021-03-06 DIAGNOSIS — E118 Type 2 diabetes mellitus with unspecified complications: Secondary | ICD-10-CM | POA: Diagnosis not present

## 2021-03-12 DIAGNOSIS — Z5181 Encounter for therapeutic drug level monitoring: Secondary | ICD-10-CM | POA: Diagnosis not present

## 2021-03-12 DIAGNOSIS — M1611 Unilateral primary osteoarthritis, right hip: Secondary | ICD-10-CM | POA: Diagnosis not present

## 2021-03-17 NOTE — Progress Notes (Signed)
Referring Provider: Deland Pretty, MD Primary Care Physician:  Deland Pretty, MD Primary Gastroenterologist:  Dr. Gala Romney  Chief Complaint  Patient presents with   Colonoscopy    Last tcs 6-7 yrs ago in [place]. Had polyps    HPI:   Luke Austin is a 60 y.o. male presenting today at the request of Deland Pretty, MD for consult colonoscopy, OV due to medications.   Last colonoscopy in [place] on 07/16/2015 with three 3-4 mm polyps resected and retrieved, otherwise normal exam.  Recommended repeat colonoscopy in 3-5 years.  Pathology revealed 2 tubular adenomas and benign polypoid colonic mucosa.  Reviewed referral information:  Office visit with PCP 11/07/2020.  Patient was following up for chronic medical conditions.  He was doing well at that time.  Amlodipine was increased to 10 mg daily.  Other medications were continued.  He was referred to GI for colonoscopy. Reviewed labs included in referral.  CMP with elevated glucose at 126, otherwise within normal limits.  CMP within normal limits.  Today:  Having right hip replacement on Friday. Had left hip replacement 6 weeks ago.   Mild constipation with pain medications. Taking stool softeners to keep regular. Working well. No brpr or melena. Denies abdominal pain, nausea, vomiting, GERD symptoms, or dysphagia.  Had gastric sleeve in 2017. Was down to 235 lbs. When he started having trouble with his hips, weight started picking up.   FHX: No colon cancer or polyps.    Reports lactose intolerance.   Past Medical History:  Diagnosis Date   Asthma    Headache    Hypertension    Lactose intolerance    Sleep apnea    uses CPAP   Type 2 diabetes mellitus [facility])     Past Surgical History:  Procedure Laterality Date   back surgeries     COLONOSCOPY  07/16/2015   Mass; Three 3-4 mm polyps resected and retrieved, otherwise normal exam.  Recommended repeat colonoscopy in 3-5 years.  Pathology revealed 2 tubular adenomas and  benign polypoid colonic mucosa.   EXCISION MASS UPPER EXTREMETIES Right 11/18/2017   Procedure: RIGHT RING FINGER EXCISION MASS AND DEBRIDEMENT  PROXIMAL INTERPHALANGEAL JOINT;  Surgeon: Daryll Brod, MD;  Location: [facility];  Service: Orthopedics;  Laterality: Right;  right ring finger   LAMINECTOMY  09/2020   T spine   LAPAROSCOPIC GASTRIC SLEEVE RESECTION     NASAL SINUS SURGERY     SPINAL CORD STIMULATOR IMPLANT  09/2020   TONSILLECTOMY AND ADENOIDECTOMY     tumor removal off vocal cords     benign per patient    Current Outpatient Medications  Medication Sig Dispense Refill   albuterol (ACCUNEB) 0.63 MG/3ML nebulizer solution Take 1 ampule by nebulization every 6 (six) hours as needed for wheezing.     amLODipine (NORVASC) 10 MG tablet Take 10 mg by mouth daily.     Baclofen 5 MG TABS Take 1 tablet by mouth 3 (three) times daily.     cetirizine (ZYRTEC) 10 MG tablet Take 10 mg by mouth daily.     Melatonin 10 MG TABS Take 20 mg by mouth at bedtime.     metoprolol tartrate (LOPRESSOR) 50 MG tablet TAKE 1 & 1 2 (ONE & ONE HALF) TABLETS BY MOUTH TWICE DAILY  3   oxyCODONE-acetaminophen (PERCOCET/ROXICET) 5-325 MG tablet Take 1 tablet by mouth every 5 (five) hours as needed. Takes every 5 hours     Semaglutide,0.25 or 0.5MG/DOS, (OZEMPIC, 0.25 OR  0.5 MG/DOSE,) 2 MG/1.5ML SOPN Inject 0.5 mg into the skin once a week.     telmisartan (MICARDIS) 80 MG tablet Take 80 mg by mouth daily.   3   Testosterone Cypionate 200 MG/ML KIT Inject 1 mL into the muscle every 21 ( twenty-one) days.     No current facility-administered medications for this visit.    Allergies as of 03/19/2021   (No Known Allergies)    Family History  Problem Relation Age of Onset   Diabetes Mother    Anxiety disorder Mother    Depression Mother    Diabetes Father    Hypertension Father    Arrhythmia Father    AAA (abdominal aortic aneurysm) Father    Epilepsy Sister    Depression Sister     Anxiety disorder Sister    Hypertension Sister    Skin cancer Sister    Hypertension Brother    Hypertension Brother    Migraines Neg Hx    Colon cancer Neg Hx    Colon polyps Neg Hx     Social History   Socioeconomic History   Marital status: Married    Spouse name: Not on file   Number of children: Not on file   Years of education: Not on file   Highest education level: Not on file  Occupational History   Not on file  Tobacco Use   Smoking status: Never   Smokeless tobacco: Never  Vaping Use   Vaping Use: Never used  Substance and Sexual Activity   Alcohol use: Yes    Comment: rare   Drug use: Never   Sexual activity: Not on file  Other Topics Concern   Not on file  Social History Narrative   Not on file   Social Determinants of Health   Financial Resource Strain: Not on file  Food Insecurity: Not on file  Transportation Needs: Not on file  Physical Activity: Not on file  Stress: Not on file  Social Connections: Not on file  Intimate Partner Violence: Not on file    Review of Systems: Gen: Denies any fever, chills, cold or flulike symptoms, presyncope, syncope. CV: Denies chest pain, heart palpitations. Resp: Denies shortness of breath or cough. GI: See HPI GU : Denies urinary burning, urinary frequency, urinary hesitancy MS: Denies joint pain Derm: Denies rash Psych: Denies depression, anxiety Heme: See HPI  Physical Exam: BP (!) 161/91   Pulse 97   Temp (!) 96.6 F (35.9 C) (Temporal)   Ht 6' 1"  (1.854 m)   Wt 260 lb (117.9 kg)   BMI 34.30 kg/m  General:   Alert and oriented. Pleasant and cooperative. Well-nourished and well-developed.  Head:  Normocephalic and atraumatic. Eyes:  Without icterus, sclera clear and conjunctiva pink.  Lungs:  Clear to auscultation bilaterally. No wheezes, rales, or rhonchi. No distress.  Heart:  S1, S2 present without murmurs appreciated.  Abdomen:  +BS, soft, non-tender and non-distended. No HSM noted. No  guarding or rebound. No masses appreciated.  Rectal:  Deferred  Msk:  Symmetrical without gross deformities. Normal posture. Extremities:  Without edema. Neurologic:  Alert and  oriented x4;  grossly normal neurologically. Skin:  Intact without significant lesions or rashes. Psych:  Normal mood and affect.    Assessment:  60 year old male with history of HTN, asthma, sleep apnea, diabetes, arthritis, presenting today to discuss scheduling surveillance colonoscopy.  Last colonoscopy March 2017 in [place] with 3 polyps removed.  Pathology revealed 2 tubular adenomas and benign  polypoid colonic mucosa.  Recommended repeat between 3-5 years.  Currently without any significant GI symptoms.  He does have mild constipation which is well controlled with stool softeners.  No alarm symptoms.  No family history of colon cancer or colon polyps.   Of note, patient had total left hip replacement 6 weeks ago and plans to undergo total right hip replacement on 12/2.  He would like to move forward with getting his colonoscopy scheduled sometime in January.   Plan:  Proceed with colonoscopy with propofol with Dr. Gala Romney around mid January. The risks, benefits, and alternatives have been discussed with the patient in detail. The patient states understanding and desires to proceed. ASA II Follow-up as needed.   Aliene Altes, PA-C [facility] 03/19/2021

## 2021-03-19 ENCOUNTER — Ambulatory Visit (INDEPENDENT_AMBULATORY_CARE_PROVIDER_SITE_OTHER): Payer: Medicare HMO | Admitting: Gastroenterology

## 2021-03-19 ENCOUNTER — Other Ambulatory Visit: Payer: Self-pay

## 2021-03-19 ENCOUNTER — Encounter: Payer: Self-pay | Admitting: Gastroenterology

## 2021-03-19 VITALS — BP 161/91 | HR 97 | Temp 96.6°F | Ht 73.0 in | Wt 260.0 lb

## 2021-03-19 DIAGNOSIS — Z86010 Personal history of colonic polyps: Secondary | ICD-10-CM | POA: Diagnosis not present

## 2021-03-19 NOTE — Patient Instructions (Signed)
We will arrange for you to have a colonoscopy in the near future with Dr. Gala Romney.  Day of your procedure: Do not take any morning diabetes medications.  We will follow-up with you as Dr. Gala Romney recommends.   It was nice meeting you today!  Good luck with your surgery on Friday!  I hope you have a very Merry Christmas!  Aliene Altes, PA-C [facility]

## 2021-03-20 HISTORY — PX: TOTAL HIP ARTHROPLASTY: SHX124

## 2021-03-21 DIAGNOSIS — M1611 Unilateral primary osteoarthritis, right hip: Secondary | ICD-10-CM | POA: Diagnosis not present

## 2021-04-01 DIAGNOSIS — Z96641 Presence of right artificial hip joint: Secondary | ICD-10-CM | POA: Diagnosis not present

## 2021-04-01 DIAGNOSIS — Z471 Aftercare following joint replacement surgery: Secondary | ICD-10-CM | POA: Diagnosis not present

## 2021-04-02 ENCOUNTER — Telehealth: Payer: Self-pay | Admitting: *Deleted

## 2021-04-02 MED ORDER — PEG 3350-KCL-NA BICARB-NACL 420 G PO SOLR: ORAL | 0 refills | Status: AC

## 2021-04-02 NOTE — Telephone Encounter (Signed)
Called pt. He has been scheduled for TCS with propofol asa 2 with Dr. Gala Romney on 1/12. Aware will mail prep instructions and send rx to pharmacy. Confirmed both.

## 2021-04-20 DIAGNOSIS — C801 Malignant (primary) neoplasm, unspecified: Secondary | ICD-10-CM

## 2021-04-20 HISTORY — DX: Malignant (primary) neoplasm, unspecified: C80.1

## 2021-04-25 NOTE — Patient Instructions (Signed)
Luke Austin  04/25/2021     @PREFPERIOPPHARMACY @   Your procedure is scheduled on  05/01/2021.   Report to [facility] at  0700 A.M.   Call this number if you have problems the morning of surgery:  [phone]   Remember:  Follow the diet and prep instructions given to you by the office.    Use your nebulizer and your inhaler before you come and bring your rescue inhaler with you.    DO NOT take any medications for diabetes the morning of your procedure.    Take these medicines the morning of surgery with A SIP OF WATER           amlodipine, gabapentin, metoprolol, oxycodone(If needed).     Do not wear jewelry, make-up or nail polish.  Do not wear lotions, powders, or perfumes, or deodorant.  Do not shave 48 hours prior to surgery.  Men may shave face and neck.  Do not bring valuables to the hospital.  [facility] is not responsible for any belongings or valuables.  Contacts, dentures or bridgework may not be worn into surgery.  Leave your suitcase in the car.  After surgery it may be brought to your room.  For patients admitted to the hospital, discharge time will be determined by your treatment team.  Patients discharged the day of surgery will not be allowed to drive home and must have someone with them for 24 hours.    Special instructions:   DO NOT smoke or vape for 24 hours before your procedure.  Please read over the following fact sheets that you were given. Anesthesia Post-op Instructions and Care and Recovery After Surgery      Colonoscopy, Adult, Care After This sheet gives you information about how to care for yourself after your procedure. Your health care provider may also give you more specific instructions. If you have problems or questions, contact your health care provider. What can I expect after the procedure? After the procedure, it is common to have: A small amount of blood in your stool for 24 hours after the procedure. Some  gas. Mild cramping or bloating of your abdomen. Follow these instructions at home: Eating and drinking  Drink enough fluid to keep your urine pale yellow. Follow instructions from your health care provider about eating or drinking restrictions. Resume your normal diet as instructed by your health care provider. Avoid heavy or fried foods that are hard to digest. Activity Rest as told by your health care provider. Avoid sitting for a long time without moving. Get up to take short walks every 1-2 hours. This is important to improve blood flow and breathing. Ask for help if you feel weak or unsteady. Return to your normal activities as told by your health care provider. Ask your health care provider what activities are safe for you. Managing cramping and bloating  Try walking around when you have cramps or feel bloated. Apply heat to your abdomen as told by your health care provider. Use the heat source that your health care provider recommends, such as a moist heat pack or a heating pad. Place a towel between your skin and the heat source. Leave the heat on for 20-30 minutes. Remove the heat if your skin turns bright red. This is especially important if you are unable to feel pain, heat, or cold. You may have a greater risk of getting burned. General instructions If you were given a sedative during  the procedure, it can affect you for several hours. Do not drive or operate machinery until your health care provider says that it is safe. For the first 24 hours after the procedure: Do not sign important documents. Do not drink alcohol. Do your regular daily activities at a slower pace than normal. Eat soft foods that are easy to digest. Take over-the-counter and prescription medicines only as told by your health care provider. Keep all follow-up visits as told by your health care provider. This is important. Contact a health care provider if: You have blood in your stool 2-3 days after the  procedure. Get help right away if you have: More than a small spotting of blood in your stool. Large blood clots in your stool. Swelling of your abdomen. Nausea or vomiting. A fever. Increasing pain in your abdomen that is not relieved with medicine. Summary After the procedure, it is common to have a small amount of blood in your stool. You may also have mild cramping and bloating of your abdomen. If you were given a sedative during the procedure, it can affect you for several hours. Do not drive or operate machinery until your health care provider says that it is safe. Get help right away if you have a lot of blood in your stool, nausea or vomiting, a fever, or increased pain in your abdomen. This information is not intended to replace advice given to you by your health care provider. Make sure you discuss any questions you have with your health care provider. Document Revised: 02/10/2019 Document Reviewed: 10/31/2018 Elsevier Patient Education  [place] After This sheet gives you information about how to care for yourself after your procedure. Your health care provider may also give you more specific instructions. If you have problems or questions, contact your health care provider. What can I expect after the procedure? After the procedure, it is common to have: Tiredness. Forgetfulness about what happened after the procedure. Impaired judgment for important decisions. Nausea or vomiting. Some difficulty with balance. Follow these instructions at home: For the time period you were told by your health care provider:   Rest as needed. Do not participate in activities where you could fall or become injured. Do not drive or use machinery. Do not drink alcohol. Do not take sleeping pills or medicines that cause drowsiness. Do not make important decisions or sign legal documents. Do not take care of children on your own. Eating and  drinking Follow the diet that is recommended by your health care provider. Drink enough fluid to keep your urine pale yellow. If you vomit: Drink water, juice, or soup when you can drink without vomiting. Make sure you have little or no nausea before eating solid foods. General instructions Have a responsible adult stay with you for the time you are told. It is important to have someone help care for you until you are awake and alert. Take over-the-counter and prescription medicines only as told by your health care provider. If you have sleep apnea, surgery and certain medicines can increase your risk for breathing problems. Follow instructions from your health care provider about wearing your sleep device: Anytime you are sleeping, including during daytime naps. While taking prescription pain medicines, sleeping medicines, or medicines that make you drowsy. Avoid smoking. Keep all follow-up visits as told by your health care provider. This is important. Contact a health care provider if: You keep feeling nauseous or you keep vomiting. You feel light-headed.  You are still sleepy or having trouble with balance after 24 hours. You develop a rash. You have a fever. You have redness or swelling around the IV site. Get help right away if: You have trouble breathing. You have new-onset confusion at home. Summary For several hours after your procedure, you may feel tired. You may also be forgetful and have poor judgment. Have a responsible adult stay with you for the time you are told. It is important to have someone help care for you until you are awake and alert. Rest as told. Do not drive or operate machinery. Do not drink alcohol or take sleeping pills. Get help right away if you have trouble breathing, or if you suddenly become confused. This information is not intended to replace advice given to you by your health care provider. Make sure you discuss any questions you have with your  health care provider. Document Revised: 12/21/2019 Document Reviewed: 03/09/2019 Elsevier Patient Education  2022 [employer].

## 2021-04-28 ENCOUNTER — Encounter (HOSPITAL_COMMUNITY): Payer: Self-pay

## 2021-04-28 ENCOUNTER — Other Ambulatory Visit: Payer: Self-pay

## 2021-04-28 ENCOUNTER — Encounter (HOSPITAL_COMMUNITY)
Admission: RE | Admit: 2021-04-28 | Discharge: 2021-04-28 | Disposition: A | Discharge status: Home / Self Care | Payer: Medicare HMO | Source: Ambulatory Visit | Attending: Internal Medicine | Admitting: Internal Medicine

## 2021-04-28 VITALS — BP 160/82 | HR 77 | Temp 97.5°F | Resp 18 | Ht 73.0 in | Wt 250.0 lb

## 2021-04-28 DIAGNOSIS — Z01818 Encounter for other preprocedural examination: Secondary | ICD-10-CM | POA: Diagnosis not present

## 2021-04-28 DIAGNOSIS — E119 Type 2 diabetes mellitus without complications: Secondary | ICD-10-CM | POA: Insufficient documentation

## 2021-04-28 HISTORY — DX: Other complications of anesthesia, initial encounter: T88.59XA

## 2021-04-28 HISTORY — DX: Nausea with vomiting, unspecified: R11.2

## 2021-04-28 HISTORY — DX: Other specified postprocedural states: Z98.890

## 2021-04-28 HISTORY — DX: Failed or difficult intubation, initial encounter: T88.4XXA

## 2021-04-28 LAB — BASIC METABOLIC PANEL
Anion gap: 8 (ref 5–15)
BUN: 19 mg/dL (ref 6–20)
CO2: 25 mmol/L (ref 22–32)
Calcium: 8.8 mg/dL — ABNORMAL LOW (ref 8.9–10.3)
Chloride: 104 mmol/L (ref 98–111)
Creatinine, Ser: 0.93 mg/dL (ref 0.61–1.24)
GFR, Estimated: >60 mL/min (ref >60)
Glucose, Bld: 119 mg/dL — ABNORMAL HIGH (ref 70–99)
Potassium: 3.5 mmol/L (ref 3.5–5.1)
Sodium: 137 mmol/L (ref 135–145)

## 2021-04-29 ENCOUNTER — Encounter (HOSPITAL_COMMUNITY): Payer: Medicare HMO

## 2021-04-29 DIAGNOSIS — Z471 Aftercare following joint replacement surgery: Secondary | ICD-10-CM | POA: Diagnosis not present

## 2021-04-29 DIAGNOSIS — Z4789 Encounter for other orthopedic aftercare: Secondary | ICD-10-CM | POA: Diagnosis not present

## 2021-05-01 ENCOUNTER — Ambulatory Visit (HOSPITAL_COMMUNITY): Payer: Medicare HMO | Admitting: Anesthesiology

## 2021-05-01 ENCOUNTER — Ambulatory Visit (HOSPITAL_COMMUNITY)
Admission: RE | Admit: 2021-05-01 | Discharge: 2021-05-01 | Disposition: A | Discharge status: Home / Self Care | Payer: Medicare HMO | Attending: Internal Medicine | Admitting: Internal Medicine

## 2021-05-01 ENCOUNTER — Encounter (HOSPITAL_COMMUNITY)
Admission: RE | Disposition: A | Discharge status: Home / Self Care | Payer: Self-pay | Source: Home / Self Care | Attending: Internal Medicine

## 2021-05-01 ENCOUNTER — Other Ambulatory Visit: Payer: Self-pay

## 2021-05-01 ENCOUNTER — Encounter (HOSPITAL_COMMUNITY): Payer: Self-pay | Admitting: Internal Medicine

## 2021-05-01 DIAGNOSIS — I10 Essential (primary) hypertension: Secondary | ICD-10-CM | POA: Diagnosis not present

## 2021-05-01 DIAGNOSIS — D122 Benign neoplasm of ascending colon: Secondary | ICD-10-CM

## 2021-05-01 DIAGNOSIS — Z1211 Encounter for screening for malignant neoplasm of colon: Secondary | ICD-10-CM | POA: Insufficient documentation

## 2021-05-01 DIAGNOSIS — J45909 Unspecified asthma, uncomplicated: Secondary | ICD-10-CM | POA: Insufficient documentation

## 2021-05-01 DIAGNOSIS — Z86010 Personal history of colonic polyps: Secondary | ICD-10-CM

## 2021-05-01 DIAGNOSIS — Z6832 Body mass index (BMI) 32.0-32.9, adult: Secondary | ICD-10-CM | POA: Diagnosis not present

## 2021-05-01 DIAGNOSIS — G4730 Sleep apnea, unspecified: Secondary | ICD-10-CM | POA: Diagnosis not present

## 2021-05-01 DIAGNOSIS — E119 Type 2 diabetes mellitus without complications: Secondary | ICD-10-CM | POA: Insufficient documentation

## 2021-05-01 DIAGNOSIS — K5730 Diverticulosis of large intestine without perforation or abscess without bleeding: Secondary | ICD-10-CM | POA: Diagnosis not present

## 2021-05-01 DIAGNOSIS — G4733 Obstructive sleep apnea (adult) (pediatric): Secondary | ICD-10-CM | POA: Diagnosis not present

## 2021-05-01 DIAGNOSIS — K635 Polyp of colon: Secondary | ICD-10-CM | POA: Diagnosis not present

## 2021-05-01 HISTORY — PX: POLYPECTOMY: SHX5525

## 2021-05-01 HISTORY — PX: COLONOSCOPY WITH PROPOFOL: SHX5780

## 2021-05-01 LAB — GLUCOSE, CAPILLARY: Glucose-Capillary: 121 mg/dL — ABNORMAL HIGH (ref 70–99)

## 2021-05-01 SURGERY — COLONOSCOPY WITH PROPOFOL
Anesthesia: General

## 2021-05-01 MED ORDER — PROPOFOL 1000 MG/100ML IV EMUL
INTRAVENOUS | Status: AC
  Filled 2021-05-01: qty 100

## 2021-05-01 MED ORDER — LIDOCAINE HCL 1 % IJ SOLN
INTRAMUSCULAR | Status: DC | PRN
  Administered 2021-05-01: 50 mg via INTRADERMAL

## 2021-05-01 MED ORDER — PROPOFOL 10 MG/ML IV BOLUS
INTRAVENOUS | Status: DC | PRN
  Administered 2021-05-01: 50 mg via INTRAVENOUS
  Administered 2021-05-01: 20 mg via INTRAVENOUS

## 2021-05-01 MED ORDER — PHENYLEPHRINE 40 MCG/ML (10ML) SYRINGE FOR IV PUSH (FOR BLOOD PRESSURE SUPPORT)
PREFILLED_SYRINGE | INTRAVENOUS | Status: DC | PRN
  Administered 2021-05-01 (×2): 80 ug via INTRAVENOUS

## 2021-05-01 MED ORDER — PROPOFOL 500 MG/50ML IV EMUL
INTRAVENOUS | Status: DC | PRN
  Administered 2021-05-01: 150 ug/kg/min via INTRAVENOUS

## 2021-05-01 MED ORDER — LACTATED RINGERS IV SOLN: INTRAVENOUS | Status: DC

## 2021-05-01 MED ORDER — PHENYLEPHRINE 40 MCG/ML (10ML) SYRINGE FOR IV PUSH (FOR BLOOD PRESSURE SUPPORT)
PREFILLED_SYRINGE | INTRAVENOUS | Status: AC
  Filled 2021-05-01: qty 10

## 2021-05-01 NOTE — Anesthesia Postprocedure Evaluation (Signed)
Anesthesia Post Note  Patient: Luke Austin  Procedure(s) Performed: COLONOSCOPY WITH PROPOFOL POLYPECTOMY  Patient location during evaluation: Phase II Anesthesia Type: General Level of consciousness: awake Pain management: pain level controlled Vital Signs Assessment: post-procedure vital signs reviewed and stable Respiratory status: spontaneous breathing and respiratory function stable Cardiovascular status: blood pressure returned to baseline and stable Postop Assessment: no headache and no apparent nausea or vomiting Anesthetic complications: no Comments: Late entry   No notable events documented.   Last Vitals:  Vitals:   05/01/21 0846 05/01/21 0857  BP:  119/73  Pulse: 71   Resp: 16   Temp:    SpO2: 96%     Last Pain:  Vitals:   05/01/21 0846  TempSrc:   PainSc: 0-No pain                 Louann Sjogren

## 2021-05-01 NOTE — H&P (Signed)
@LOGO @   Primary Care Physician:  Deland Pretty, MD Primary Gastroenterologist:  Dr. Gala Romney  Pre-Procedure History & Physical: HPI:  Luke Austin is a 61 y.o. male here for Surveillance colonoscopy.  [place].  No bowel symptoms currently.  He is here for surveillance colonoscopy per plan.  Past Medical History:  Diagnosis Date   Asthma    Complication of anesthesia    difficult to wake up   Difficult intubation    Headache    Hypertension    Lactose intolerance    PONV (postoperative nausea and vomiting)    Sleep apnea    uses CPAP   Type 2 diabetes mellitus [facility])     Past Surgical History:  Procedure Laterality Date   back surgeries     COLONOSCOPY  07/16/2015   Mass; Three 3-4 mm polyps resected and retrieved, otherwise normal exam.  Recommended repeat colonoscopy in 3-5 years.  Pathology revealed 2 tubular adenomas and benign polypoid colonic mucosa.   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 Authorizing Provider  acetaminophen (TYLENOL) 500 MG tablet Take 500 mg by mouth 5 (five) times daily.   Yes [provider]  albuterol (ACCUNEB) 0.63 MG/3ML nebulizer solution Take 1 ampule by nebulization every 6 (six) hours as needed for wheezing or shortness of breath.   Yes  [provider]  amLODipine (NORVASC) 10 MG tablet Take 10 mg by mouth daily.   Yes [provider]  CALCIUM CITRATE PO Take 3 tablets by mouth at bedtime.   Yes [provider]  cetirizine (ZYRTEC) 10 MG tablet Take 10 mg by mouth daily.   Yes [provider]  gabapentin (NEURONTIN) 600 MG tablet Take 600 mg by mouth at bedtime.   Yes [provider]  Melatonin 10 MG TABS Take 20 mg by mouth at bedtime.   Yes [provider]  metoprolol tartrate (LOPRESSOR) 50 MG tablet Take 50-100 mg by mouth See admin instructions. 50 mg in the morning, 100 mg at bedtime 09/29/17  Yes [provider]  Multiple Vitamins-Minerals (MULTIVITAMIN WITH MINERALS) tablet Take 1 tablet by mouth daily.   Yes [provider]  oxyCODONE (OXY IR/ROXICODONE) 5 MG immediate release tablet Take 5 mg by mouth 5 (five) times daily. 04/10/21  Yes [provider]  polyethylene glycol-electrolytes (NULYTELY) 420 g solution As directed 04/02/21  Yes Jr Milliron, Cristopher Estimable, MD  telmisartan (MICARDIS) 80 MG tablet Take 80 mg by mouth daily.  09/29/17  Yes [provider]  Testosterone Cypionate 200 MG/ML KIT Inject 200 mg into the muscle every 21 ( twenty-one) days.   Yes [provider]  vitamin B-12 (CYANOCOBALAMIN) 1000 MCG tablet Take 1,000 mcg by mouth daily.   Yes [provider]  vitamin  E 180 MG (400 UNITS) capsule Take 400 Units by mouth daily.   Yes [provider]    Allergies as of 04/02/2021   (No Known Allergies)    Family History  Problem Relation Age of Onset   Diabetes Mother    Anxiety disorder Mother    Depression Mother    Diabetes Father    Hypertension Father    Arrhythmia Father    AAA (abdominal aortic aneurysm) Father    Epilepsy Sister    Depression Sister    Anxiety disorder Sister    Hypertension Sister    Skin cancer Sister    Hypertension Brother    Hypertension Brother    Migraines  Neg Hx    Colon cancer Neg Hx    Colon polyps Neg Hx     Social History   Socioeconomic History   Marital status: Married    Spouse name: Not on file   Number of children: Not on file   Years of education: Not on file   Highest education level: Not on file  Occupational History   Not on file  Tobacco Use   Smoking status: Never   Smokeless tobacco: Never  Vaping Use   Vaping Use: Never used  Substance and Sexual Activity   Alcohol use: Yes    Comment: rare   Drug use: Never   Sexual activity: Not on file  Other Topics Concern   Not on file  Social History Narrative   Not on file   Social Determinants of Health   Financial Resource Strain: Not on file  Food Insecurity: Not on file  Transportation Needs: Not on file  Physical Activity: Not on file  Stress: Not on file  Social Connections: Not on file  Intimate Partner Violence: Not on file    Review of Systems: See HPI, otherwise negative ROS  Physical Exam: BP 125/85    Pulse 72    Temp 98.6 F (37 C) (Oral)    Resp 16    Ht 6' 1"  (1.854 m)    Wt 113.4 kg    SpO2 95%    BMI 32.98 kg/m  General:   Alert,  Well-developed, well-nourished, pleasant and cooperative in NAD Neck:  Supple; no masses or thyromegaly. No significant cervical adenopathy. Lungs:  Clear throughout to auscultation.   No wheezes, crackles, or rhonchi. No acute distress. Heart:  Regular rate and rhythm; no murmurs, clicks, rubs,  or gallops. Abdomen: Non-distended, normal bowel sounds.  Soft and nontender without appreciable mass or hepatosplenomegaly.  Pulses:  Normal pulses noted. Extremities:  Without clubbing or edema.  Impression/Plan:    61 year old gentleman here for a surveillance colonoscopy.  History of colonic adenomas removed previously.  The risks, benefits, limitations, alternatives and imponderables have been reviewed with the patient. Questions have been answered. All parties are agreeable.       Notice: This dictation was  prepared with Dragon dictation along with smaller phrase technology. Any transcriptional errors that result from this process are unintentional and may not be corrected upon review.

## 2021-05-01 NOTE — Anesthesia Preprocedure Evaluation (Addendum)
Anesthesia Evaluation  Patient identified by MRN, date of birth, ID band Patient awake    Reviewed: Allergy & Precautions, H&P , NPO status , Patient's Chart, lab work & pertinent test results, reviewed documented beta blocker date and time   History of Anesthesia Complications (+) PONV, PROLONGED EMERGENCE and history of anesthetic complications  Airway Mallampati: II  TM Distance: >3 FB Neck ROM: full    Dental no notable dental hx.    Pulmonary asthma , sleep apnea and Continuous Positive Airway Pressure Ventilation ,    Pulmonary exam normal breath sounds clear to auscultation       Cardiovascular Exercise Tolerance: Good hypertension, negative cardio ROS   Rhythm:regular Rate:Normal     Neuro/Psych  Headaches, PSYCHIATRIC DISORDERS Depression    GI/Hepatic negative GI ROS, Neg liver ROS,   Endo/Other  diabetes, Well Controlled, Type 2Morbid obesity  Renal/GU negative Renal ROS  negative genitourinary   Musculoskeletal   Abdominal   Peds  Hematology negative hematology ROS (+)   Anesthesia Other Findings Per anesthesia record at [facility], there is no difficulty in intubation.  Reproductive/Obstetrics negative OB ROS                            Anesthesia Physical Anesthesia Plan  ASA: 3  Anesthesia Plan: General   Post-op Pain Management:    Induction:   PONV Risk Score and Plan: Propofol infusion  Airway Management Planned:   Additional Equipment:   Intra-op Plan:   Post-operative Plan:   Informed Consent: I have reviewed the patients History and Physical, chart, labs and discussed the procedure including the risks, benefits and alternatives for the proposed anesthesia with the patient or authorized representative who has indicated his/her understanding and acceptance.     Dental Advisory Given  Plan Discussed with: CRNA  Anesthesia Plan Comments:         Anesthesia Quick Evaluation

## 2021-05-01 NOTE — Anesthesia Procedure Notes (Signed)
Date/Time: 05/01/2021 8:37 AM Performed by: Ollen Bowl, CRNA Pre-anesthesia Checklist: Patient identified, Emergency Drugs available, Suction available and Patient being monitored Patient Re-evaluated:Patient Re-evaluated prior to induction Oxygen Delivery Method: Nasal cannula Induction Type: IV induction Placement Confirmation: positive ETCO2

## 2021-05-01 NOTE — Op Note (Signed)
[facility] Patient Name: Luke Austin Procedure Date: 05/01/2021 7:59 AM MRN: [id number] Date of Birth: [birth year] Attending MD: Norvel Richards , MD CSN: [id number] Age: 61 Admit Type: Outpatient Procedure:                Colonoscopy Indications:              High risk colon cancer surveillance: Personal                            history of colonic polyps Providers:                Norvel Richards, MD, Caprice Kluver, Aram Candela Referring MD:              Medicines:                Propofol per Anesthesia Complications:            No immediate complications. Estimated Blood Loss:     Estimated blood loss was minimal. Estimated blood                            loss was minimal. Procedure:                Pre-Anesthesia Assessment:                           - Prior to the procedure, a History and Physical                            was performed, and patient medications and                            allergies were reviewed. The patient's tolerance of                            previous anesthesia was also reviewed. The risks                            and benefits of the procedure and the sedation                            options and risks were discussed with the patient.                            All questions were answered, and informed consent                            was obtained. Prior Anticoagulants: The patient has                            taken no previous anticoagulant or antiplatelet                            agents. ASA Grade Assessment: III - A patient with  severe systemic disease. After reviewing the risks                            and benefits, the patient was deemed in                            satisfactory condition to undergo the procedure.                           After obtaining informed consent, the colonoscope                            was passed under direct vision. Throughout the                             procedure, the patient's blood pressure, pulse, and                            oxygen saturations were monitored continuously. The                            [id number] scope was introduced through the                            anus and advanced to the the cecum, identified by                            appendiceal orifice and ileocecal valve. The                            colonoscopy was performed without difficulty. The                            patient tolerated the procedure well. The quality                            of the bowel preparation was adequate. Scope In: 8:30:20 AM Scope Out: 8:42:49 AM Scope Withdrawal Time: 0 hours 9 minutes 58 seconds  Total Procedure Duration: 0 hours 12 minutes 29 seconds  Findings:      The perianal and digital rectal examinations were normal.      A 5 mm polyp was found in the ascending colon. The polyp was sessile.       The polyp was removed with a cold snare. Resection and retrieval were       complete. Estimated blood loss was minimal.      Scattered medium-mouthed diverticula were found in the sigmoid colon and       descending colon.      The exam was otherwise without abnormality on direct and retroflexion       views. Impression:               - One 5 mm polyp in the ascending colon, removed                            with a  cold snare. Resected and retrieved.                           - Diverticulosis in the sigmoid colon and in the                            descending colon.                           - The examination was otherwise normal on direct                            and retroflexion views. Moderate Sedation:      Moderate (conscious) sedation was personally administered by an       anesthesia professional. The following parameters were monitored: oxygen       saturation, heart rate, blood pressure, respiratory rate, EKG, adequacy       of pulmonary ventilation, and response to care. Recommendation:           -  Patient has a contact number available for                            emergencies. The signs and symptoms of potential                            delayed complications were discussed with the                            patient. Return to normal activities tomorrow.                            Written discharge instructions were provided to the                            patient.                           - Resume previous diet.                           - Continue present medications.                           - Repeat colonoscopy date to be determined after                            pending pathology results are reviewed for                            surveillance.                           - Return to GI office (date not yet determined). Procedure Code(s):        --- Professional ---  45385, Colonoscopy, flexible; with removal of                            tumor(s), polyp(s), or other lesion(s) by snare                            technique Diagnosis Code(s):        --- Professional ---                           Z86.010, Personal history of colonic polyps                           K63.5, Polyp of colon                           K57.30, Diverticulosis of large intestine without                            perforation or abscess without bleeding CPT copyright 2019 American [employer]. All rights reserved. The codes documented in this report are preliminary and upon coder review may  be revised to meet current compliance requirements. Cristopher Estimable. Nashae Maudlin, MD Norvel Richards, MD 05/01/2021 8:50:45 AM This report has been signed electronically. Number of Addenda: 0

## 2021-05-01 NOTE — Discharge Instructions (Addendum)
°  Colonoscopy Discharge Instructions  Read the instructions outlined below and refer to this sheet in the next few weeks. These discharge instructions provide you with general information on caring for yourself after you leave the hospital. Your doctor may also give you specific instructions. While your treatment has been planned according to the most current medical practices available, unavoidable complications occasionally occur. If you have any problems or questions after discharge, call Dr. Gala Romney at [phone]. ACTIVITY You may resume your regular activity, but move at a slower pace for the next 24 hours.  Take frequent rest periods for the next 24 hours.  Walking will help get rid of the air and reduce the bloated feeling in your belly (abdomen).  No driving for 24 hours (because of the medicine (anesthesia) used during the test).   Do not sign any important legal documents or operate any machinery for 24 hours (because of the anesthesia used during the test).  NUTRITION Drink plenty of fluids.  You may resume your normal diet as instructed by your doctor.  Begin with a light meal and progress to your normal diet. Heavy or fried foods are harder to digest and may make you feel sick to your stomach (nauseated).  Avoid alcoholic beverages for 24 hours or as instructed.  MEDICATIONS You may resume your normal medications unless your doctor tells you otherwise.  WHAT YOU CAN EXPECT TODAY Some feelings of bloating in the abdomen.  Passage of more gas than usual.  Spotting of blood in your stool or on the toilet paper.  IF YOU HAD POLYPS REMOVED DURING THE COLONOSCOPY: No aspirin products for 7 days or as instructed.  No alcohol for 7 days or as instructed.  Eat a soft diet for the next 24 hours.  FINDING OUT THE RESULTS OF YOUR TEST Not all test results are available during your visit. If your test results are not back during the visit, make an appointment with your caregiver to find out the  results. Do not assume everything is normal if you have not heard from your caregiver or the medical facility. It is important for you to follow up on all of your test results.  SEEK IMMEDIATE MEDICAL ATTENTION IF: You have more than a spotting of blood in your stool.  Your belly is swollen (abdominal distention).  You are nauseated or vomiting.  You have a temperature over 101.  You have abdominal pain or discomfort that is severe or gets worse throughout the day.      1 polyp removed in your colon today  Colon polyp and diverticulosis information provided   further recommendations to follow pending review of pathology report   at patient request, I called Dondra Prader [phone] -  reviewed findings and recommendations

## 2021-05-01 NOTE — Transfer of Care (Signed)
Immediate Anesthesia Transfer of Care Note  Patient: Luke Austin  Procedure(s) Performed: COLONOSCOPY WITH PROPOFOL POLYPECTOMY  Patient Location: Short Stay  Anesthesia Type:General  Level of Consciousness: drowsy  Airway & Oxygen Therapy: Patient Spontanous Breathing  Post-op Assessment: Report given to RN and Post -op Vital signs reviewed and stable  Post vital signs: Reviewed and stable  Last Vitals:  Vitals Value Taken Time  BP    Temp    Pulse    Resp    SpO2      Last Pain:  Vitals:   05/01/21 0823  TempSrc:   PainSc: 0-No pain         Complications: No notable events documented.

## 2021-05-02 LAB — SURGICAL PATHOLOGY

## 2021-05-05 ENCOUNTER — Encounter: Payer: Self-pay | Admitting: Internal Medicine

## 2021-05-05 ENCOUNTER — Encounter (HOSPITAL_COMMUNITY): Payer: Self-pay | Admitting: Internal Medicine

## 2021-05-09 DIAGNOSIS — R7989 Other specified abnormal findings of blood chemistry: Secondary | ICD-10-CM | POA: Diagnosis not present

## 2021-05-09 DIAGNOSIS — R7303 Prediabetes: Secondary | ICD-10-CM | POA: Diagnosis not present

## 2021-05-09 DIAGNOSIS — E291 Testicular hypofunction: Secondary | ICD-10-CM | POA: Diagnosis not present

## 2021-05-13 DIAGNOSIS — R7301 Impaired fasting glucose: Secondary | ICD-10-CM | POA: Diagnosis not present

## 2021-05-13 DIAGNOSIS — E291 Testicular hypofunction: Secondary | ICD-10-CM | POA: Diagnosis not present

## 2021-05-13 DIAGNOSIS — R7303 Prediabetes: Secondary | ICD-10-CM | POA: Diagnosis not present

## 2021-05-13 DIAGNOSIS — Z6835 Body mass index (BMI) 35.0-35.9, adult: Secondary | ICD-10-CM | POA: Diagnosis not present

## 2021-05-13 DIAGNOSIS — G4733 Obstructive sleep apnea (adult) (pediatric): Secondary | ICD-10-CM | POA: Diagnosis not present

## 2021-05-13 DIAGNOSIS — R7989 Other specified abnormal findings of blood chemistry: Secondary | ICD-10-CM | POA: Diagnosis not present

## 2021-06-03 DIAGNOSIS — J209 Acute bronchitis, unspecified: Secondary | ICD-10-CM | POA: Diagnosis not present

## 2021-06-04 ENCOUNTER — Ambulatory Visit: Payer: Medicare HMO | Admitting: Neurology

## 2021-06-04 ENCOUNTER — Telehealth: Payer: Self-pay | Admitting: Neurology

## 2021-06-04 NOTE — Telephone Encounter (Signed)
Called the pt back to offer an opening with Debbora Presto, NP. The patient is wanting to see an MD. He denied the apt that was open for 2/16.  I advised that I would place him on my cancellation list and contact him once an opening comes available with Dr. Brett Fairy.  Patient proceeded to be upset stating " it is not my fault that the physician had to be out of the office today, I have been struggling with these headaches and been waiting for this appointment.  He stated he canceled last minute when he would be expected to pay a no-show fee: We will cancel on them as we should be able to get him worked [place] the patient that if he needs to cancel last minute due to an illness/emergency we do take in consideration. I advised that this is out of our hands as to why she had to be out of the office today.  I reiterated I had an opening where I could get him worked in with our Designer, jewellery and he refused.  Advised the patient once more that I don't have anything available to offer but I will keep him on the wait list and see if we can work him in as soon as I see a cancellation come open. He proceeded to fuss about how he was coming in for injections today, which I didn't see that.  The visit was a follow up appt. We ended the call at that time.   Upon reviewing in more detail, it appears that the pt was referred to Dr Jaynee Eagles from Dr Brett Fairy to get botox but was unable to get auth through insurance. Dr Jaynee Eagles saw the pt in sept 2022 and completed occiptial nerve block and advised the pt at that time he would be referred out to Dr Brien Few at [facility] for these moving forward.  I will have the referrals team follow up whether the referral took place at [facility]. If so, pt should get in with them to have the injections done which is what he really wanted.

## 2021-06-04 NOTE — Telephone Encounter (Signed)
Pt would like a call from the nurse to discuss a sooner appt than April. Having severe migraines can not wait until April

## 2021-06-12 ENCOUNTER — Encounter: Payer: Self-pay | Admitting: Neurology

## 2021-06-12 ENCOUNTER — Ambulatory Visit: Payer: Medicare HMO | Admitting: Neurology

## 2021-06-12 VITALS — BP 158/93 | HR 71 | Ht 73.0 in | Wt 259.5 lb

## 2021-06-12 DIAGNOSIS — Z9689 Presence of other specified functional implants: Secondary | ICD-10-CM

## 2021-06-12 DIAGNOSIS — M542 Cervicalgia: Secondary | ICD-10-CM

## 2021-06-12 DIAGNOSIS — M5481 Occipital neuralgia: Secondary | ICD-10-CM

## 2021-06-12 NOTE — Progress Notes (Signed)
Nerve block-w-steroid: Pt signed consentYES  0.5% Bupivocaine 1 mL LOT: [id number] EXP: 06/18/2021 NDC: 56389-373-42   Here for 6 month f/u. Per pt HA have gotten worse and come and go. Pt recently had bilateral hip replacement. Reports having head pressure but do to being on pain killer from his surgery he was not having pn.  2% Lidocaine 1 mL [id number] EXP: 06/19/2021 NDC: 20355-974-16  Methylprednisolone- 80 mg/ml- 70m LLAG:[id number]-01/2023 [id number]Dr[facility] BBrien Fewfor pain management. Phone: [phone] Patient had refused referral, stating he thought this was for his low back and hips.  There he has already management of his Spinal cord stimulator.    I will no longer provide cervical pain treatment, this headache is a radiation pain from occipital / cervical spine. The same is stated by Dr AJaynee Eagles  This has been stated to primary care physician , Dr . PShelia Media and the patient.    [facility]    Provider:  Dr  DBrett Fairy MD  Requesting Provider: PDeland Pretty MD Primary Care Provider:  PDeland Pretty MD  CC:  cervicogenic headaches/Cervicalgia  HPI:  Luke Haithcockis a 61y.o. male here as requested by PDeland Pretty MD for "headaches." Which are radiating from his neck spine-  A steady deep deep pain, not throbbing or pulsating or pounding, started 2 years ago without inciting event, just came one day acutely at full forse, different locations can be unilateral or bilateral, prior never really had headaches, there is a migraine history in maternal aunts. Nausea, but he does have phonophobia, rare vomiting, can last 4-12 hours if he takes something, tylenol doesn't help, no medication overuse, no aura, daily headache, moderate to severe for hours, nothing really seems to help. No neck pain but the pain starts at the occipital area of the head, no neck pain or radicular symptoms. No other focal  neurologic deficits, associated symptoms, inciting events or modifiable factors.  He has  undergone Hip surgery in Nov and Dec, 6 th weeks apart, was on narcotic pain medications but walking soon pain free after.  Ow back pain is cured and legs are not buckling. Can walk up and down stairs.  Had a colonoscopy in January, no anesthesia problem ; Reviewed notes, labs and imaging from outside physicians, which showed: From a thorough review of records, medications tried that can be used in headache or migraine management include: Amlodipine, baclofen, neurontin, metoprolol, ondantron, phenergan, scopolamine, tizamnidine, topiramate, amitriptyline.   Review of Systems: Patient complains of symptoms per HPI as well as the following symptoms headache. Pertinent negatives and positives per HPI. All others negative.  Patient is agitated.   Chronic pain has improved, neck pain is still present.    Social History   Socioeconomic History   Marital status: Married    Spouse name: Not on file   Number of children: Not on file   Years of education: Not on file   Highest education level: Not on file  Occupational History   Not on file  Tobacco Use   Smoking status: Never   Smokeless tobacco: Never  Vaping Use   Vaping Use: Never used  Substance and Sexual Activity   Alcohol use: Yes    Comment: rare   Drug use: Never   Sexual activity: Not on file  Other Topics Concern   Not on file  Social History Narrative   Not on file   Social Determinants of Health   Financial Resource Strain:  Not on file  Food Insecurity: Not on file  Transportation Needs: Not on file  Physical Activity: Not on file  Stress: Not on file  Social Connections: Not on file  Intimate Partner Violence: Not on file    Family History  Problem Relation Age of Onset   Diabetes Mother    Anxiety disorder Mother    Depression Mother    Diabetes Father    Hypertension Father    Arrhythmia Father    AAA (abdominal  aortic aneurysm) Father    Epilepsy Sister    Depression Sister    Anxiety disorder Sister    Hypertension Sister    Skin cancer Sister    Hypertension Brother    Hypertension Brother    Migraines Neg Hx    Colon cancer Neg Hx    Colon polyps Neg Hx     Past Medical History:  Diagnosis Date   Asthma    Complication of anesthesia    difficult to wake up   Difficult intubation    Headache    Hypertension    Lactose intolerance    PONV (postoperative nausea and vomiting)    Sleep apnea    uses CPAP   Type 2 diabetes mellitus ([place])     Patient Active Problem List   Diagnosis Date Noted   History of adenomatous polyp of colon 03/19/2021   Cervico-occipital neuralgia 12/30/2020   Cervicalgia 12/30/2020   S/P insertion of spinal cord stimulator 10/17/2020   Occipital headache 10/17/2020   Trigger point with neck pain 03/19/2020   Bilateral occipital neuralgia 03/19/2020   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   Depression 04/07/2016   OSA (obstructive sleep apnea) 07/25/2015   Dermatofibroma of left lower extremity 06/27/2015   Multiple benign melanocytic nevi 06/27/2015   Elevated ferritin 06/25/2015   Vitamin D deficiency 06/25/2015   Sleep apnea 06/19/2015   Spinal stenosis 05/10/2015   Elevated glucose 04/11/2015   Abnormal chest x-ray 04/04/2015   Chronic lower back pain 12/27/2014   Hypertension 09/14/2014   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  Recommended repeat colonoscopy in 3-5 years.  Pathology revealed 2 tubular adenomas and benign polypoid colonic mucosa.   COLONOSCOPY WITH PROPOFOL N/A 05/01/2021   Procedure: COLONOSCOPY WITH PROPOFOL;  Surgeon: Daneil Dolin, MD;  [facility];   Service: Endoscopy;  Laterality: N/A;  8:15am ASA 2   EXCISION MASS UPPER EXTREMETIES Right 11/18/2017   Procedure: RIGHT RING FINGER EXCISION MASS AND DEBRIDEMENT  PROXIMAL INTERPHALANGEAL JOINT;  Surgeon: Daryll Brod, MD;  Location: [place];  Service: Orthopedics;  Laterality: Right;  right ring finger   LAMINECTOMY  09/2020   T spine   LAPAROSCOPIC GASTRIC SLEEVE RESECTION     NASAL SINUS SURGERY     POLYPECTOMY  05/01/2021   Procedure: POLYPECTOMY;  Surgeon: Daneil Dolin, MD;  Location: [facility];  Service: Endoscopy;;   SPINAL CORD STIMULATOR IMPLANT  09/2020   TONSILLECTOMY AND ADENOIDECTOMY     TOTAL HIP ARTHROPLASTY Left 02/2021   TOTAL HIP ARTHROPLASTY Right 03/2021   tumor removal off vocal cords     benign per patient    Current Outpatient Medications  Medication Sig Dispense Refill  acetaminophen (TYLENOL) 500 MG tablet Take 500 mg by mouth 5 (five) times daily.     albuterol (ACCUNEB) 0.63 MG/3ML nebulizer solution Take 1 ampule by nebulization every 6 (six) hours as needed for wheezing or shortness of breath.     amLODipine (NORVASC) 10 MG tablet Take 10 mg by mouth daily.     CALCIUM CITRATE PO Take 3 tablets by mouth at bedtime.     cetirizine (ZYRTEC) 10 MG tablet Take 10 mg by mouth daily.     Melatonin 10 MG TABS Take 20 mg by mouth at bedtime.     metoprolol tartrate (LOPRESSOR) 50 MG tablet Take 50-100 mg by mouth See admin instructions. 50 mg in the morning, 100 mg at bedtime  3   Multiple Vitamins-Minerals (MULTIVITAMIN WITH MINERALS) tablet Take 1 tablet by mouth daily.     oxyCODONE (OXY IR/ROXICODONE) 5 MG immediate release tablet Take 5 mg by mouth 5 (five) times daily.     polyethylene glycol-electrolytes (NULYTELY) 420 g solution As directed 4000 mL 0   telmisartan (MICARDIS) 80 MG tablet Take 80 mg by mouth daily.   3   Testosterone Cypionate 200 MG/ML KIT Inject 200 mg into the muscle every 21 ( twenty-one) days.     vitamin B-12  (CYANOCOBALAMIN) 1000 MCG tablet Take 1,000 mcg by mouth daily.     vitamin E 180 MG (400 UNITS) capsule Take 400 Units by mouth daily.     No current facility-administered medications for this visit.    Allergies as of 06/12/2021   (No Known Allergies)    Vitals: BP (!) 158/93    Pulse 71    Ht 6' 1" (1.854 m)    Wt 259 lb 8 oz (117.7 kg)    BMI 34.24 kg/m  Last Weight:  Wt Readings from Last 1 Encounters:  06/12/21 259 lb 8 oz (117.7 kg)   Last Height:   Ht Readings from Last 1 Encounters:  06/12/21 6' 1" (1.854 m)   Exam: NAD, pleasant                  Speech:    Speech is normal; fluent and spontaneous with normal comprehension.  Cognition:    The patient is oriented to person, place, and time;     recent and remote memory intact;     language fluent;    Cranial Nerves:    The pupils are equal, round, and reactive to light.Trigeminal sensation is intact and the muscles of mastication are normal. The face is symmetric. The palate elevates in the midline. Hearing intact. Voice is normal. Shoulder shrug is normal.  The tongue has normal motion without fasciculations.   Coordination:  No dysmetria  Motor Observation:    No asymmetry, no atrophy, and no involuntary movements noted. Neck ROM is improved.  Tone:    Normal muscle tone. Increased tone at the neck.      Strength:    Strength is V/V in the upper and lower limbs.      Sensation: intact to LT   DTR:  symmetric , 1 plus.   Assessment/Plan:  61 year old male with cervicalgia/occipital neuralgia. Neuro exam in non focal. Pain is bilateral and is located in the distribution of the greater, lesser and/or third occipital nerves, sore and continuous, with tenderness and trigger points at the emergence of the greater occipital nerve.   - Physical therapy - He declined in the past but we have recommend manual therapy, stretching,  heating, dry needling, strength exercises or any modality per evaluation.  - MRI of  the cervical spine - he declined, also has hardware (spinal stimulator) - Epidural Steroid INjections or medial branch blocks for neck pain and occipital neuralgia- back to [place] Baclofen prn  - Nerve blocks today very successful: consider RFA in the future - He declines botox which I discussed is more indicated for migraines and doesn't usually work for occipital neuralgia even though he does have some migrainous features - Nerve blocks today   All procedures a documented below were medically necessary, reasonable and appropriate based on the patient's history, medical diagnosis and physician opinion.   Verbal informed consent was obtained from the patient, patient was informed of potential risk of procedure, including bruising, bleeding, hematoma formation, infection, muscle weakness, muscle pain, numbness, transient hypertension, transient hyperglycemia and transient insomnia among others. All areas injected were topically clean with isopropyl rubbing alcohol. Nonsterile nonlatex gloves were worn during the procedure.  1. Greater occipital nerve block [phone]). The greater occipital nerve site was identified at the nuchal line medial to the occipital artery. Medication was injected into the left and right occipital nerve areas and suboccipital areas. Patient's condition is associated with inflammation of the greater occipital nerve and associated multiple groups.  Injection was deemed medically necessary, reasonable and appropriate.  Injection represents a separate and unique surgical service.  Patient will from now be followed by his orthopedic/ pain management doctors. Referral twice sent to [facility]/Dr. Brien Few for pain management. Phone: [phone]. Patient had refused referral, stating he thought this was for his low back and hips.  There he has already management of his Spinal cord stimulator.    I will no longer provide cervical pain treatment, this "headache" is a  radiation-pain from occipital / cervical spine.  The same has been stated by Dr Jaynee Eagles.  This has been stated to primary care physician , Dr. Shelia Media, and the patient.   Cc: Deland Pretty, MD,  Deland Pretty, MD  Larey Seat, MD   [facility] [address] [place], [place] [address]  Phone [phone] Fax [phone]  I spent 30 minutes of face-to-face and non-face-to-face time with patient on the   diagnosis.  This included previsit chart review, lab review, study review, order entry, electronic health record documentation, patient education on the different diagnostic and therapeutic options, counseling and coordination of care, risks and benefits of management, compliance, or risk factor reduction.  This does not include time spent on occipital nerve blocks.

## 2021-07-15 ENCOUNTER — Other Ambulatory Visit: Payer: Self-pay | Admitting: Orthopedic Surgery

## 2021-07-15 DIAGNOSIS — Z96643 Presence of artificial hip joint, bilateral: Secondary | ICD-10-CM | POA: Diagnosis not present

## 2021-07-15 DIAGNOSIS — M259 Joint disorder, unspecified: Secondary | ICD-10-CM | POA: Diagnosis not present

## 2021-08-01 ENCOUNTER — Ambulatory Visit
Admission: RE | Admit: 2021-08-01 | Discharge: 2021-08-01 | Disposition: A | Discharge status: Home / Self Care | Payer: Medicare HMO | Source: Ambulatory Visit | Attending: Orthopedic Surgery | Admitting: Orthopedic Surgery

## 2021-08-01 ENCOUNTER — Other Ambulatory Visit: Payer: Medicare HMO

## 2021-08-01 DIAGNOSIS — M259 Joint disorder, unspecified: Secondary | ICD-10-CM

## 2021-08-01 DIAGNOSIS — M533 Sacrococcygeal disorders, not elsewhere classified: Secondary | ICD-10-CM | POA: Diagnosis not present

## 2021-08-01 MED ORDER — METHYLPREDNISOLONE ACETATE 40 MG/ML INJ SUSP (RADIOLOG
80.0000 mg | Freq: Once | INTRAMUSCULAR | Status: AC
  Administered 2021-08-01: 80 mg via INTRA_ARTICULAR

## 2021-08-07 ENCOUNTER — Other Ambulatory Visit: Payer: Medicare HMO

## 2021-08-27 DIAGNOSIS — Z6833 Body mass index (BMI) 33.0-33.9, adult: Secondary | ICD-10-CM | POA: Diagnosis not present

## 2021-08-27 DIAGNOSIS — M461 Sacroiliitis, not elsewhere classified: Secondary | ICD-10-CM | POA: Diagnosis not present

## 2021-08-27 DIAGNOSIS — G894 Chronic pain syndrome: Secondary | ICD-10-CM | POA: Diagnosis not present

## 2021-08-27 DIAGNOSIS — M546 Pain in thoracic spine: Secondary | ICD-10-CM | POA: Diagnosis not present

## 2021-09-03 DIAGNOSIS — M5416 Radiculopathy, lumbar region: Secondary | ICD-10-CM | POA: Diagnosis not present

## 2021-09-03 DIAGNOSIS — M25552 Pain in left hip: Secondary | ICD-10-CM | POA: Diagnosis not present

## 2021-09-03 DIAGNOSIS — M25551 Pain in right hip: Secondary | ICD-10-CM | POA: Diagnosis not present

## 2021-09-11 DIAGNOSIS — I10 Essential (primary) hypertension: Secondary | ICD-10-CM | POA: Diagnosis not present

## 2021-09-17 DIAGNOSIS — M545 Low back pain, unspecified: Secondary | ICD-10-CM | POA: Diagnosis not present

## 2021-09-17 DIAGNOSIS — M5459 Other low back pain: Secondary | ICD-10-CM | POA: Diagnosis not present

## 2021-09-18 ENCOUNTER — Other Ambulatory Visit: Payer: Self-pay | Admitting: Specialist

## 2021-09-18 ENCOUNTER — Other Ambulatory Visit (HOSPITAL_COMMUNITY): Payer: Self-pay | Admitting: Specialist

## 2021-09-18 DIAGNOSIS — M5459 Other low back pain: Secondary | ICD-10-CM

## 2021-09-22 DIAGNOSIS — Z79890 Hormone replacement therapy (postmenopausal): Secondary | ICD-10-CM | POA: Diagnosis not present

## 2021-09-22 DIAGNOSIS — E291 Testicular hypofunction: Secondary | ICD-10-CM | POA: Diagnosis not present

## 2021-09-22 IMAGING — CT CT L SPINE W/ CM
1 of 7 series · 6 of 14 positions shown, 8 images · non-contrast
Comparison: MRI lumbar spine dated January 16, 2020. MRI thoracic
spine dated December 01, 2003.

CLINICAL DATA: Chronic, progressively worsening low back pain
intermittently radiating down both legs. History of prior
thoracolumbar decompression and lumbar fusion.
TECHNIQUE: Contiguous axial images were obtained through the thoracic and
lumbar spine after the intrathecal infusion of contrast. Coronal and
sagittal reconstructions were obtained of the axial image sets.

[Series 3: l spine soft · axial · 0.44mm/px · z∈[-627,-441]mm · 6 of 88 slices shown, 8 images]
[im 13/88  soft-tissue]
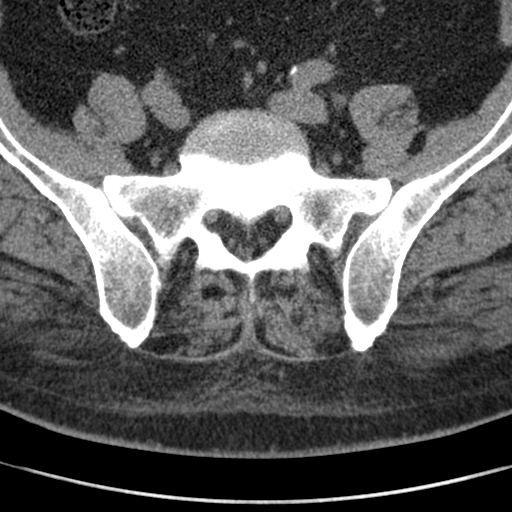
[im 13/88  bone]
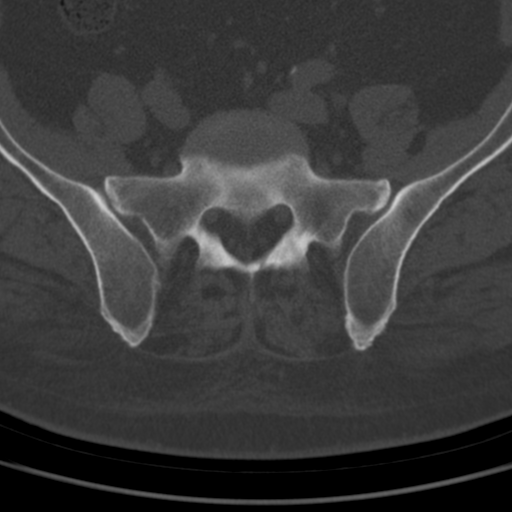
[im 25/88  bone]
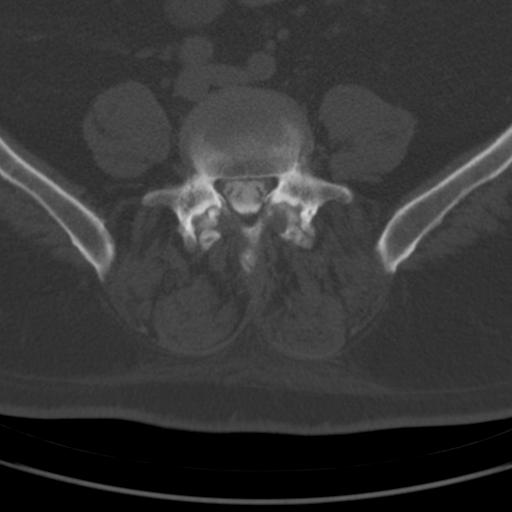
[im 38/88  bone]
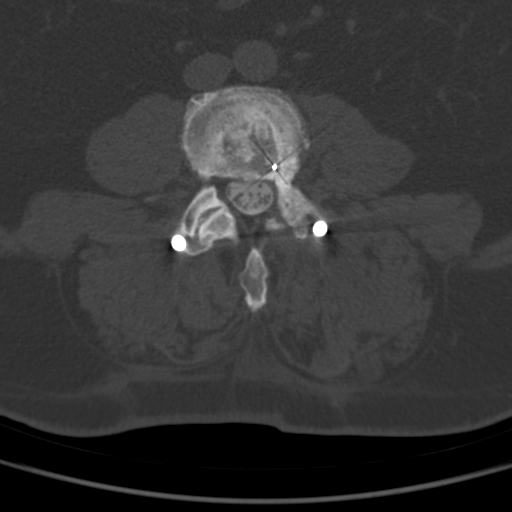
[im 50/88  bone]
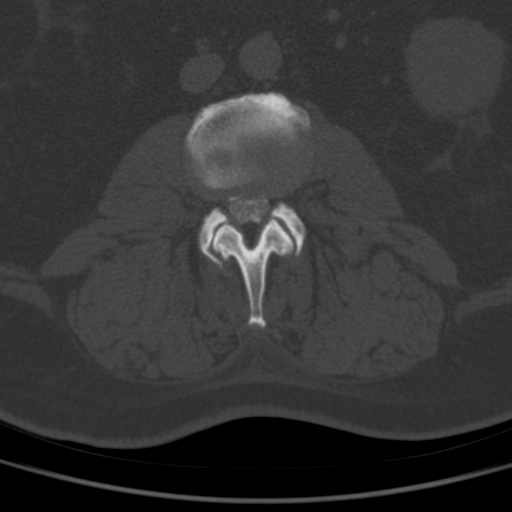
[im 63/88  soft-tissue]
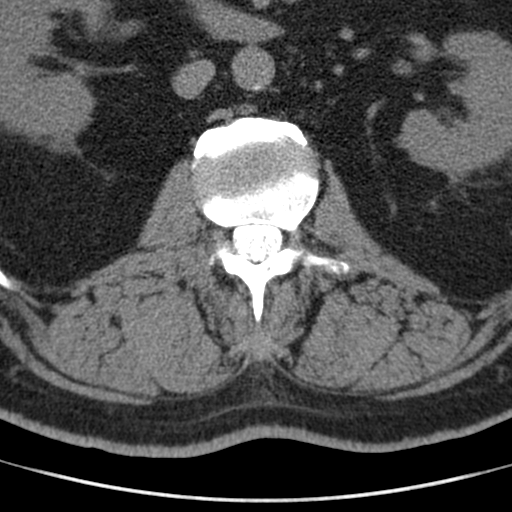
[im 63/88  bone]
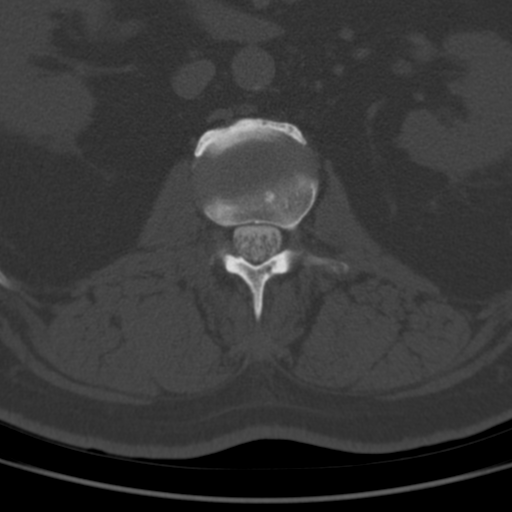
[im 75/88  bone]
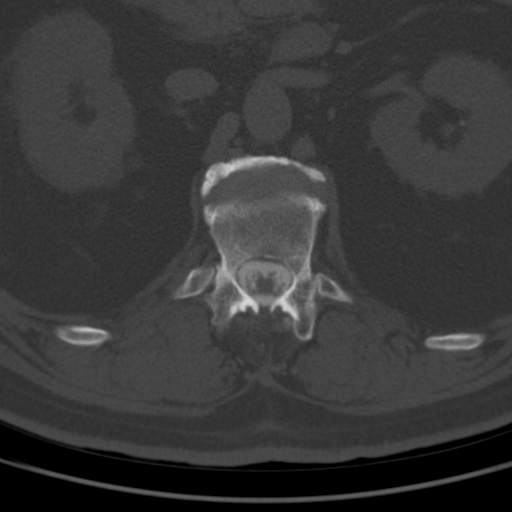

[6 of 14 positions shown; findings below may reference images not displayed]

EXAM:
THORACIC AND LUMBAR MYELOGRAM

CT THORACIC MYELOGRAM

CT LUMBAR MYELOGRAM

FLUOROSCOPY TIME:  Radiation Exposure Index (as provided by the
fluoroscopic device): 69.7 mGy

Fluoroscopy Time:  3 minutes, 47 seconds

Number of Acquired Images:  26

PROCEDURE:
LUMBAR PUNCTURE FOR THORACIC AND LUMBAR MYELOGRAM

After thorough discussion of risks and benefits of the procedure
including bleeding, infection, injury to nerves, blood vessels,
adjacent structures as well as headache and CSF leak, written and
oral informed consent was obtained. Consent was obtained by Dr.
Euloge Hinsia.

Patient was positioned prone on the fluoroscopy table. Local
anesthesia was provided with 1% lidocaine without epinephrine after
prepped and draped in the usual sterile fashion. After reviewing
prior imaging, puncture was performed at L1-L2 using a 5 inch
22-gauge spinal needle via right interlaminar approach. Using a
single pass through the dura, the needle was placed within the
thecal sac, with return of clear CSF. 10 mL Isovue [id number] was
injected into the thecal sac, with normal opacification of the nerve
roots and cauda equina consistent with free flow within the
subarachnoid space. The patient was then moved to the trendelenburg
position and contrast flowed into the thoracic spine region.

I personally performed the lumbar puncture and administered the
intrathecal contrast. I also personally supervised acquisition of
the myelogram images.
FINDINGS: THORACIC AND LUMBAR MYELOGRAM FINDINGS:

Thoracic spine: Normal alignment. No large ventral extradural defect
or high-grade spinal canal stenosis.

Lumbar spine: Unchanged trace retrolisthesis at L2-L3. No dynamic
instability. Prior L3-L4 PLIF. Small ventral extradural defects at
L1-L2 and L2-L3. Mild spinal canal stenosis at L2-L3. No nerve root
effacement.

CT THORACIC MYELOGRAM FINDINGS:

There are 13 rib-bearing thoracic vertebral bodies. The last
rib-bearing thoracic vertebral body is labeled T13.

Alignment: Slightly exaggerated thoracic kyphosis at the
thoracolumbar junction. No listhesis.

Vertebrae: No acute fracture or other focal pathologic process.

Cord: Normal in bulk and morphology.

Paraspinal and other soft tissues: Prior gastric sleeve resection.
Small hiatal hernia. Mild thoracic aortic atherosclerosis.

Disc levels:

Posterior disc osteophyte complexes and bilateral uncovertebral
hypertrophy at C6-C7 and C7-T1. Mild to moderate left neuroforaminal
stenosis at C6-C7 and mild right neuroforaminal stenosis at C7-T1.
No spinal canal stenosis.

T1-T2: Small central disc protrusion. Mild left facet arthropathy.
Mild left neuroforaminal stenosis.

T2-T3 to T6-T7: Negative.

T7-T8: Negative disc.  Mild right facet arthropathy.  No stenosis.

T8-T9: Negative disc. Mild right facet arthropathy. Borderline mild
right neuroforaminal stenosis. No spinal canal or left
neuroforaminal stenosis.

T9-T10: Negative.

T10-T11: Unchanged small right paracentral disc protrusion
contacting and flattening the right ventral cord. No stenosis.

T11-T12: Prior posterior decompression. Mild bilateral facet
arthropathy. No stenosis.

T12-T13: Prior posterior decompression.  No stenosis.

CT LUMBAR MYELOGRAM FINDINGS:

Segmentation: Standard.

Alignment: Unchanged trace retrolisthesis at L2-L3.

Vertebrae: Prior L3-L4 PLIF with solid fusion. No evidence of
hardware failure or loosening. No acute fracture or other focal
pathologic process. Unchanged L2 inferior endplate Schmorl's nodes.

Conus medullaris and cauda equina: Conus extends to the L1 level.
Conus and cauda equina appear normal.

Paraspinal and other soft tissues: Small bilateral nonobstructive
renal calculi. Mild aortoiliac atherosclerotic vascular disease.

Disc levels:

T13-L1:  No significant disc bulge or herniation.  No stenosis.

L1-L2: Unchanged minimal disc bulging with mild to moderate right
and mild left facet arthropathy. No stenosis.

L2-L3: Unchanged mild disc bulging, endplate spurring, and moderate
bilateral facet arthropathy. Unchanged mild to moderate spinal canal
stenosis. No neuroforaminal stenosis.

L3-L4: Prior PLIF. Unchanged scarring in the left neural foramen. No
residual stenosis.

L4-L5: No significant disc bulge or herniation. Unchanged moderate
bilateral facet arthropathy. Unchanged borderline mild left
neuroforaminal stenosis. No spinal canal or right neuroforaminal
stenosis.

L5-S1: Unchanged left paracentral disc protrusion compressing the
left S1 nerve root. Unchanged mild bilateral facet arthropathy.
Unchanged borderline mild bilateral neuroforaminal stenosis. No
spinal canal stenosis.
IMPRESSION: THORACIC SPINE:

1. Transitional thoracolumbar anatomy with 13 rib-bearing vertebral
bodies. The last rib-bearing vertebral body is labeled T13.
Correlation with radiographs is recommended prior to any operative
intervention.
2. Unchanged small right paracentral disc protrusion at T10-T11
contacting and flattening the right ventral cord.

LUMBAR SPINE:

1. Prior L3-L4 PLIF with solid fusion and no residual stenosis.
2. Unchanged mild-to-moderate spinal canal stenosis at L2-L3.
3. Unchanged left paracentral disc protrusion at L5-S1 compressing
the left S1 nerve root.
4. Bilateral nonobstructive nephrolithiasis.
5. Aortic Atherosclerosis ([id number]-X86.6).

## 2021-10-08 ENCOUNTER — Ambulatory Visit (HOSPITAL_COMMUNITY)
Admission: RE | Admit: 2021-10-08 | Discharge: 2021-10-08 | Disposition: A | Discharge status: Home / Self Care | Payer: Medicare HMO | Source: Ambulatory Visit | Attending: Specialist | Admitting: Specialist

## 2021-10-08 ENCOUNTER — Encounter (HOSPITAL_COMMUNITY): Payer: Self-pay

## 2021-10-08 DIAGNOSIS — M5459 Other low back pain: Secondary | ICD-10-CM | POA: Insufficient documentation

## 2021-10-10 ENCOUNTER — Ambulatory Visit (HOSPITAL_COMMUNITY)
Admission: RE | Admit: 2021-10-10 | Discharge: 2021-10-10 | Disposition: A | Discharge status: Home / Self Care | Payer: Medicare HMO | Source: Ambulatory Visit | Attending: Specialist | Admitting: Specialist

## 2021-10-10 ENCOUNTER — Other Ambulatory Visit (HOSPITAL_COMMUNITY): Payer: Self-pay | Admitting: Specialist

## 2021-10-10 DIAGNOSIS — M5127 Other intervertebral disc displacement, lumbosacral region: Secondary | ICD-10-CM | POA: Diagnosis not present

## 2021-10-10 DIAGNOSIS — M5136 Other intervertebral disc degeneration, lumbar region: Secondary | ICD-10-CM | POA: Diagnosis not present

## 2021-10-10 DIAGNOSIS — M795 Residual foreign body in soft tissue: Secondary | ICD-10-CM | POA: Diagnosis not present

## 2021-10-10 DIAGNOSIS — M48061 Spinal stenosis, lumbar region without neurogenic claudication: Secondary | ICD-10-CM | POA: Diagnosis not present

## 2021-10-10 DIAGNOSIS — Z9889 Other specified postprocedural states: Secondary | ICD-10-CM | POA: Diagnosis not present

## 2021-10-10 DIAGNOSIS — M5459 Other low back pain: Secondary | ICD-10-CM | POA: Diagnosis not present

## 2021-10-10 DIAGNOSIS — M5126 Other intervertebral disc displacement, lumbar region: Secondary | ICD-10-CM | POA: Diagnosis not present

## 2021-10-15 DIAGNOSIS — M5136 Other intervertebral disc degeneration, lumbar region: Secondary | ICD-10-CM | POA: Diagnosis not present

## 2021-10-27 DIAGNOSIS — I10 Essential (primary) hypertension: Secondary | ICD-10-CM | POA: Diagnosis not present

## 2021-10-27 DIAGNOSIS — N410 Acute prostatitis: Secondary | ICD-10-CM | POA: Diagnosis not present

## 2021-10-27 DIAGNOSIS — E118 Type 2 diabetes mellitus with unspecified complications: Secondary | ICD-10-CM | POA: Diagnosis not present

## 2021-10-27 DIAGNOSIS — Z125 Encounter for screening for malignant neoplasm of prostate: Secondary | ICD-10-CM | POA: Diagnosis not present

## 2021-10-27 DIAGNOSIS — E291 Testicular hypofunction: Secondary | ICD-10-CM | POA: Diagnosis not present

## 2021-10-29 DIAGNOSIS — M5416 Radiculopathy, lumbar region: Secondary | ICD-10-CM | POA: Diagnosis not present

## 2021-10-29 DIAGNOSIS — Z6833 Body mass index (BMI) 33.0-33.9, adult: Secondary | ICD-10-CM | POA: Diagnosis not present

## 2021-11-03 NOTE — Progress Notes (Unsigned)
   I, Wendy Poet, LAT, ATC, am serving as scribe for Dr. Lynne Leader.  Luke Austin is a 61 y.o. male who presents to [facility] today for discussion in how to exercise/become more active w/ all of his medical co-morbidities.   He was last seen by Dr. Georgina Snell on 04/24/19 for LBP.  Today, pt reports .  Of note, pt is being followed by [facility] and had a lumbar laminectomy w/ placement of spinal cord stimulators performed in June 2022.  He had B THR in 2022 (Oct and Dec 2022) and has had multiple spine surgeries.  Aggravating factors: walking; physical exertion Treatments tried: oxycodone; PT x1 visit  Diagnostic testing: L-spine MRI- 10/10/21; Pelvis XR- 10/10/21; L-spine myelogram, CT of T- and L-spine- 04/30/20; L-spine and pelvise XR- 04/24/19   Pertinent review of systems: No fevers or chills.  Positive for unintentional weight gain.  Relevant historical information: Spinal stenosis.  Spinal cord stimulator.   Exam:  BP 110/78 (BP Location: Right Arm, Patient Position: Sitting, Cuff Size: Large)   Pulse 70   Ht '6\' 2"'$  (1.88 m)   Wt 256 lb 12.8 oz (116.5 kg)   SpO2 96%   BMI 32.97 kg/m  General: Well Developed, well nourished, and in no acute distress.   MSK: L-spine: Decreased lumbar motion. Lower extremity decreased quad bulk bilaterally.  Normal gait.     Assessment and Plan: 61 y.o. male with chronic low back pain following spinal stenosis and multiple back surgeries as well as a spinal cord stimulator.  He has struggled significantly over the last several years to regain function and is currently disabled.  He is struggling with some weight gain as result of his relative immobility and needs some help with weight loss and some ideas for exercise.  Unfortunately he does not know how to swim.  He is comfortable with the idea of water aerobics and possibly an upright exercise bike.  We will try that along with some  seated isolated resistance training.  Fortunately he does have silver sneakers as part of his insurance and so has access to the [facility].  We also talked about nutrition.  He is trying to lose weight.  Set a calorie goal for just under 2000 cal/day and a protein goal for 25% of his calories and protein which would be 125 g of protein a day.  Recheck as needed.    Discussed warning signs or symptoms. Please see discharge instructions. Patient expresses understanding.   The above documentation has been reviewed and is accurate and complete Lynne Leader, M.D.

## 2021-11-04 ENCOUNTER — Encounter: Payer: Self-pay | Admitting: Family Medicine

## 2021-11-04 ENCOUNTER — Ambulatory Visit: Payer: Medicare HMO | Admitting: Family Medicine

## 2021-11-04 VITALS — BP 110/78 | HR 70 | Ht 74.0 in | Wt 256.8 lb

## 2021-11-04 DIAGNOSIS — M545 Low back pain, unspecified: Secondary | ICD-10-CM

## 2021-11-04 DIAGNOSIS — Z6832 Body mass index (BMI) 32.0-32.9, adult: Secondary | ICD-10-CM

## 2021-11-04 DIAGNOSIS — M5416 Radiculopathy, lumbar region: Secondary | ICD-10-CM | POA: Diagnosis not present

## 2021-11-04 DIAGNOSIS — G8929 Other chronic pain: Secondary | ICD-10-CM

## 2021-11-04 DIAGNOSIS — Z6833 Body mass index (BMI) 33.0-33.9, adult: Secondary | ICD-10-CM | POA: Diagnosis not present

## 2021-11-04 NOTE — Patient Instructions (Addendum)
Good to see you today.  Less than 2000 calories a day .   Main focus is 125g protein a day.    Water aerobics or aquatic therapy would be a good option for you.  Stationary bike. Upright  Weight machines at the gym that isolate one body part at a time like knee extension machine.  Follow-up: as needed

## 2021-11-11 ENCOUNTER — Other Ambulatory Visit: Payer: Self-pay | Admitting: Internal Medicine

## 2021-11-11 DIAGNOSIS — E291 Testicular hypofunction: Secondary | ICD-10-CM | POA: Diagnosis not present

## 2021-11-11 DIAGNOSIS — Z0000 Encounter for general adult medical examination without abnormal findings: Secondary | ICD-10-CM | POA: Diagnosis not present

## 2021-11-11 DIAGNOSIS — J309 Allergic rhinitis, unspecified: Secondary | ICD-10-CM | POA: Diagnosis not present

## 2021-11-11 DIAGNOSIS — I10 Essential (primary) hypertension: Secondary | ICD-10-CM | POA: Diagnosis not present

## 2021-11-11 DIAGNOSIS — E118 Type 2 diabetes mellitus with unspecified complications: Secondary | ICD-10-CM | POA: Diagnosis not present

## 2021-11-11 DIAGNOSIS — M5442 Lumbago with sciatica, left side: Secondary | ICD-10-CM | POA: Diagnosis not present

## 2021-11-11 DIAGNOSIS — R7989 Other specified abnormal findings of blood chemistry: Secondary | ICD-10-CM | POA: Diagnosis not present

## 2021-11-11 DIAGNOSIS — N529 Male erectile dysfunction, unspecified: Secondary | ICD-10-CM | POA: Diagnosis not present

## 2021-11-11 DIAGNOSIS — G4733 Obstructive sleep apnea (adult) (pediatric): Secondary | ICD-10-CM | POA: Diagnosis not present

## 2021-11-11 DIAGNOSIS — J4540 Moderate persistent asthma, uncomplicated: Secondary | ICD-10-CM | POA: Diagnosis not present

## 2021-11-11 DIAGNOSIS — L918 Other hypertrophic disorders of the skin: Secondary | ICD-10-CM | POA: Diagnosis not present

## 2021-11-13 ENCOUNTER — Other Ambulatory Visit (HOSPITAL_COMMUNITY): Payer: Self-pay | Admitting: Internal Medicine

## 2021-11-13 DIAGNOSIS — I10 Essential (primary) hypertension: Secondary | ICD-10-CM

## 2021-12-02 DIAGNOSIS — M5416 Radiculopathy, lumbar region: Secondary | ICD-10-CM | POA: Diagnosis not present

## 2021-12-03 ENCOUNTER — Encounter (HOSPITAL_BASED_OUTPATIENT_CLINIC_OR_DEPARTMENT_OTHER): Payer: Self-pay

## 2021-12-03 ENCOUNTER — Ambulatory Visit (HOSPITAL_BASED_OUTPATIENT_CLINIC_OR_DEPARTMENT_OTHER)
Admission: RE | Admit: 2021-12-03 | Discharge: 2021-12-03 | Disposition: A | Discharge status: Home / Self Care | Payer: Medicare HMO | Source: Ambulatory Visit | Attending: Internal Medicine | Admitting: Internal Medicine

## 2021-12-03 DIAGNOSIS — I10 Essential (primary) hypertension: Secondary | ICD-10-CM | POA: Insufficient documentation

## 2021-12-16 DIAGNOSIS — I2510 Atherosclerotic heart disease of native coronary artery without angina pectoris: Secondary | ICD-10-CM | POA: Diagnosis not present

## 2021-12-23 DIAGNOSIS — M5442 Lumbago with sciatica, left side: Secondary | ICD-10-CM | POA: Diagnosis not present

## 2021-12-23 DIAGNOSIS — M5441 Lumbago with sciatica, right side: Secondary | ICD-10-CM | POA: Diagnosis not present

## 2021-12-23 DIAGNOSIS — G8929 Other chronic pain: Secondary | ICD-10-CM | POA: Diagnosis not present

## 2021-12-23 DIAGNOSIS — M5136 Other intervertebral disc degeneration, lumbar region: Secondary | ICD-10-CM | POA: Diagnosis not present

## 2022-01-07 DIAGNOSIS — Z9689 Presence of other specified functional implants: Secondary | ICD-10-CM | POA: Diagnosis not present

## 2022-01-07 DIAGNOSIS — M5416 Radiculopathy, lumbar region: Secondary | ICD-10-CM | POA: Diagnosis not present

## 2022-01-07 DIAGNOSIS — G894 Chronic pain syndrome: Secondary | ICD-10-CM | POA: Diagnosis not present

## 2022-01-07 DIAGNOSIS — M25552 Pain in left hip: Secondary | ICD-10-CM | POA: Diagnosis not present

## 2022-01-07 DIAGNOSIS — I10 Essential (primary) hypertension: Secondary | ICD-10-CM | POA: Diagnosis not present

## 2022-01-12 DIAGNOSIS — D485 Neoplasm of uncertain behavior of skin: Secondary | ICD-10-CM | POA: Diagnosis not present

## 2022-01-12 DIAGNOSIS — L820 Inflamed seborrheic keratosis: Secondary | ICD-10-CM | POA: Diagnosis not present

## 2022-01-12 DIAGNOSIS — L821 Other seborrheic keratosis: Secondary | ICD-10-CM | POA: Diagnosis not present

## 2022-01-12 DIAGNOSIS — D225 Melanocytic nevi of trunk: Secondary | ICD-10-CM | POA: Diagnosis not present

## 2022-01-12 DIAGNOSIS — Z1283 Encounter for screening for malignant neoplasm of skin: Secondary | ICD-10-CM | POA: Diagnosis not present

## 2022-01-28 DIAGNOSIS — L98429 Non-pressure chronic ulcer of back with unspecified severity: Secondary | ICD-10-CM | POA: Diagnosis not present

## 2022-01-28 DIAGNOSIS — D485 Neoplasm of uncertain behavior of skin: Secondary | ICD-10-CM | POA: Diagnosis not present

## 2022-01-30 ENCOUNTER — Other Ambulatory Visit: Payer: Medicare HMO

## 2022-02-06 DIAGNOSIS — I10 Essential (primary) hypertension: Secondary | ICD-10-CM | POA: Diagnosis not present

## 2022-02-11 DIAGNOSIS — F5104 Psychophysiologic insomnia: Secondary | ICD-10-CM | POA: Diagnosis not present

## 2022-02-11 DIAGNOSIS — Z8659 Personal history of other mental and behavioral disorders: Secondary | ICD-10-CM | POA: Diagnosis not present

## 2022-02-11 DIAGNOSIS — R69 Illness, unspecified: Secondary | ICD-10-CM | POA: Diagnosis not present

## 2022-02-11 DIAGNOSIS — L03012 Cellulitis of left finger: Secondary | ICD-10-CM | POA: Diagnosis not present

## 2022-03-02 DIAGNOSIS — M109 Gout, unspecified: Secondary | ICD-10-CM | POA: Diagnosis not present

## 2022-03-02 DIAGNOSIS — M79675 Pain in left toe(s): Secondary | ICD-10-CM | POA: Diagnosis not present

## 2022-03-02 DIAGNOSIS — M2012 Hallux valgus (acquired), left foot: Secondary | ICD-10-CM | POA: Diagnosis not present

## 2022-03-05 DIAGNOSIS — G894 Chronic pain syndrome: Secondary | ICD-10-CM | POA: Diagnosis not present

## 2022-03-05 DIAGNOSIS — Z6833 Body mass index (BMI) 33.0-33.9, adult: Secondary | ICD-10-CM | POA: Diagnosis not present

## 2022-03-16 ENCOUNTER — Other Ambulatory Visit (HOSPITAL_COMMUNITY): Payer: Self-pay | Admitting: Neurological Surgery

## 2022-03-16 DIAGNOSIS — G894 Chronic pain syndrome: Secondary | ICD-10-CM

## 2022-03-25 DIAGNOSIS — L03012 Cellulitis of left finger: Secondary | ICD-10-CM | POA: Diagnosis not present

## 2022-03-25 DIAGNOSIS — R69 Illness, unspecified: Secondary | ICD-10-CM | POA: Diagnosis not present

## 2022-03-25 DIAGNOSIS — F5104 Psychophysiologic insomnia: Secondary | ICD-10-CM | POA: Diagnosis not present

## 2022-04-08 ENCOUNTER — Encounter (HOSPITAL_COMMUNITY): Payer: Self-pay

## 2022-04-08 ENCOUNTER — Ambulatory Visit (HOSPITAL_COMMUNITY)
Admission: RE | Admit: 2022-04-08 | Discharge: 2022-04-08 | Disposition: A | Discharge status: Home / Self Care | Payer: Medicare HMO | Source: Ambulatory Visit | Attending: Neurological Surgery | Admitting: Neurological Surgery

## 2022-04-08 DIAGNOSIS — M4316 Spondylolisthesis, lumbar region: Secondary | ICD-10-CM | POA: Diagnosis not present

## 2022-04-08 DIAGNOSIS — M461 Sacroiliitis, not elsewhere classified: Secondary | ICD-10-CM | POA: Diagnosis not present

## 2022-04-08 DIAGNOSIS — G894 Chronic pain syndrome: Secondary | ICD-10-CM | POA: Diagnosis not present

## 2022-04-08 DIAGNOSIS — M5416 Radiculopathy, lumbar region: Secondary | ICD-10-CM | POA: Diagnosis not present

## 2022-04-08 DIAGNOSIS — M545 Low back pain, unspecified: Secondary | ICD-10-CM | POA: Diagnosis not present

## 2022-04-08 DIAGNOSIS — Z6833 Body mass index (BMI) 33.0-33.9, adult: Secondary | ICD-10-CM | POA: Diagnosis not present

## 2022-04-08 DIAGNOSIS — Z9689 Presence of other specified functional implants: Secondary | ICD-10-CM | POA: Diagnosis not present

## 2022-04-21 DIAGNOSIS — R1310 Dysphagia, unspecified: Secondary | ICD-10-CM | POA: Diagnosis not present

## 2022-04-21 DIAGNOSIS — J385 Laryngeal spasm: Secondary | ICD-10-CM | POA: Diagnosis not present

## 2022-04-27 DIAGNOSIS — M2022 Hallux rigidus, left foot: Secondary | ICD-10-CM | POA: Diagnosis not present

## 2022-04-27 DIAGNOSIS — M79675 Pain in left toe(s): Secondary | ICD-10-CM | POA: Diagnosis not present

## 2022-04-27 DIAGNOSIS — M10072 Idiopathic gout, left ankle and foot: Secondary | ICD-10-CM | POA: Diagnosis not present

## 2022-05-14 DIAGNOSIS — M5416 Radiculopathy, lumbar region: Secondary | ICD-10-CM | POA: Diagnosis not present

## 2022-05-14 DIAGNOSIS — G894 Chronic pain syndrome: Secondary | ICD-10-CM | POA: Diagnosis not present

## 2022-05-21 ENCOUNTER — Other Ambulatory Visit: Payer: Self-pay | Admitting: Neurological Surgery

## 2022-06-02 DIAGNOSIS — M5416 Radiculopathy, lumbar region: Secondary | ICD-10-CM | POA: Diagnosis not present

## 2022-06-02 DIAGNOSIS — Z6834 Body mass index (BMI) 34.0-34.9, adult: Secondary | ICD-10-CM | POA: Diagnosis not present

## 2022-06-03 DIAGNOSIS — J385 Laryngeal spasm: Secondary | ICD-10-CM | POA: Diagnosis not present

## 2022-06-03 DIAGNOSIS — J4540 Moderate persistent asthma, uncomplicated: Secondary | ICD-10-CM | POA: Diagnosis not present

## 2022-07-06 NOTE — Pre-Procedure Instructions (Signed)
Surgical Instructions    Your procedure is scheduled on Friday, March 22.  Report to [facility] Main Entrance "A" at 5:30 A.M., then check in with the Admitting office.  Call this number if you have problems the morning of surgery:  [phone]   If you have any questions prior to your surgery date call [phone]: Open Monday-Friday 8am-4pm If you experience any cold or flu symptoms such as cough, fever, chills, shortness of breath, etc. between now and your scheduled surgery, please notify Luke Austin at the above number     Remember:  Do not eat or drink after midnight the night before your surgery     Take these medicines the morning of surgery with A SIP OF WATER:  acetaminophen (TYLENOL)  amLODipine (NORVASC)  cetirizine (ZYRTEC)  metoprolol tartrate (LOPRESSOR)  oxyCODONE (OXY IR/ROXICODONE)   If Needed: albuterol (ACCUNEB) 0.63 MG/3ML nebulizer solution - Please bring all inhalers with you the day of surgery.   As of today, STOP taking any Aspirin (unless otherwise instructed by your surgeon) Aleve, Naproxen, Ibuprofen, Motrin, Advil, Goody's, BC's, all herbal medications, fish oil, and all vitamins.  **Please bring the controller for your spinal cord stimulator**  [facility] is not responsible for any belongings or valuables.    Do NOT Smoke (Tobacco/Vaping)  24 hours prior to your procedure  If you use a CPAP at night, you may bring your mask for your overnight stay.   Contacts, glasses, hearing aids, dentures or partials may not be worn into surgery, please bring cases for these belongings   For patients admitted to the hospital, discharge time will be determined by your treatment team.   Patients discharged the day of surgery will not be allowed to drive home, and someone needs to stay with them for 24 hours.   SURGICAL WAITING ROOM VISITATION Patients having surgery or a procedure may have no more than 2 support people in the waiting area - these visitors may  rotate.   Children under the age of 46 must have an adult with them who is not the patient. If the patient needs to stay at the hospital during part of their recovery, the visitor guidelines for inpatient rooms apply. Pre-op nurse will coordinate an appropriate time for 1 support person to accompany patient in pre-op.  This support person may not rotate.   Please refer to [email or web address] for the visitor guidelines for Inpatients (after your surgery is over and you are in a regular room).    Special instructions:    Oral Hygiene is also important to reduce your risk of infection.  Remember - BRUSH YOUR TEETH THE MORNING OF SURGERY WITH YOUR REGULAR TOOTHPASTE   [facility]- Preparing For Surgery  Before surgery, you can play an important role. Because skin is not sterile, your skin needs to be as free of germs as possible. You can reduce the number of germs on your skin by washing with CHG (chlorahexidine gluconate) Soap before surgery.  CHG is an antiseptic cleaner which kills germs and bonds with the skin to continue killing germs even after washing.     Please do not use if you have an allergy to CHG or antibacterial soaps. If your skin becomes reddened/irritated stop using the CHG.  Do not shave (including legs and underarms) for at least 48 hours prior to first CHG shower. It is OK to shave your face.  Please follow these instructions carefully.     Shower the [employer] SURGERY and the  MORNING OF SURGERY with CHG Soap.   If you chose to wash your hair, wash your hair first as usual with your normal shampoo. After you shampoo, rinse your hair and body thoroughly to remove the shampoo.  Then [employer] and genitals (private parts) with your normal soap and rinse thoroughly to remove soap.  After that Use CHG Soap as you would any other liquid soap. You can apply CHG directly to the skin and wash gently with a scrungie or a clean  washcloth.   Apply the CHG Soap to your body ONLY FROM THE NECK DOWN.  Do not use on open wounds or open sores. Avoid contact with your eyes, ears, mouth and genitals (private parts). Wash Face and genitals (private parts)  with your normal soap.   Wash thoroughly, paying special attention to the area where your surgery will be performed.  Thoroughly rinse your body with warm water from the neck down.  DO NOT shower/wash with your normal soap after using and rinsing off the CHG Soap.  Pat yourself dry with a CLEAN TOWEL.  Wear CLEAN PAJAMAS to bed the night before surgery  Place CLEAN SHEETS on your bed the night before your surgery  DO NOT SLEEP WITH PETS.   Day of Surgery:  Take a shower with CHG soap. Wear Clean/Comfortable clothing the morning of surgery     Do not wear jewelry or makeup. Do not wear lotions, powders, perfumes/cologne or deodorant. Do not shave 48 hours prior to surgery.  Men may shave face and neck. Do not bring valuables to the hospital. Do not wear nail polish, gel polish, artificial nails, or any other type of covering on natural nails (fingers and toes) If you have artificial nails or gel coating that need to be removed by a nail salon, please have this removed prior to surgery. Artificial nails or gel coating may interfere with anesthesia's ability to adequately monitor your vital signs. Remember to brush your teeth WITH YOUR REGULAR TOOTHPASTE.    If you received a COVID test during your pre-op visit, it is requested that you wear a mask when out in public, stay away from anyone that may not be feeling well, and notify your surgeon if you develop symptoms. If you have been in contact with anyone that has tested positive in the last 10 days, please notify your surgeon.    Please read over the following fact sheets that you were given.

## 2022-07-07 ENCOUNTER — Encounter (HOSPITAL_COMMUNITY): Payer: Self-pay

## 2022-07-07 ENCOUNTER — Encounter (HOSPITAL_COMMUNITY): Payer: Self-pay | Admitting: Vascular Surgery

## 2022-07-07 ENCOUNTER — Other Ambulatory Visit: Payer: Self-pay

## 2022-07-07 ENCOUNTER — Encounter (HOSPITAL_COMMUNITY)
Admission: RE | Admit: 2022-07-07 | Discharge: 2022-07-07 | Disposition: A | Discharge status: Home / Self Care | Payer: Medicare HMO | Source: Ambulatory Visit | Attending: Neurological Surgery | Admitting: Neurological Surgery

## 2022-07-07 VITALS — BP 144/91 | HR 75 | Temp 97.7°F | Resp 19 | Ht 74.0 in | Wt 268.0 lb

## 2022-07-07 DIAGNOSIS — Z01818 Encounter for other preprocedural examination: Secondary | ICD-10-CM | POA: Insufficient documentation

## 2022-07-07 DIAGNOSIS — I10 Essential (primary) hypertension: Secondary | ICD-10-CM | POA: Insufficient documentation

## 2022-07-07 DIAGNOSIS — E119 Type 2 diabetes mellitus without complications: Secondary | ICD-10-CM | POA: Insufficient documentation

## 2022-07-07 HISTORY — DX: Gout, unspecified: M10.9

## 2022-07-07 LAB — CBC
HCT: 43.4 % (ref 39.0–52.0)
Hemoglobin: 15.4 g/dL (ref 13.0–17.0)
MCH: 32 pg (ref 26.0–34.0)
MCHC: 35.5 g/dL (ref 30.0–36.0)
MCV: 90.2 fL (ref 80.0–100.0)
Platelets: 235 10*3/uL (ref 150–400)
RBC: 4.81 MIL/uL (ref 4.22–5.81)
RDW: 12.8 % (ref 11.5–15.5)
WBC: 6.5 10*3/uL (ref 4.0–10.5)
nRBC: 0 % (ref 0.0–0.2)

## 2022-07-07 LAB — BASIC METABOLIC PANEL
Anion gap: 7 (ref 5–15)
BUN: 14 mg/dL (ref 6–20)
CO2: 30 mmol/L (ref 22–32)
Calcium: 9.1 mg/dL (ref 8.9–10.3)
Chloride: 99 mmol/L (ref 98–111)
Creatinine, Ser: 0.84 mg/dL (ref 0.61–1.24)
GFR, Estimated: >60 mL/min (ref >60)
Glucose, Bld: 123 mg/dL — ABNORMAL HIGH (ref 70–99)
Potassium: 3.2 mmol/L — ABNORMAL LOW (ref 3.5–5.1)
Sodium: 136 mmol/L (ref 135–145)

## 2022-07-07 LAB — SURGICAL PCR SCREEN
MRSA, PCR: POSITIVE — AB
Staphylococcus aureus: POSITIVE — AB

## 2022-07-07 LAB — NO BLOOD PRODUCTS

## 2022-07-07 LAB — HEMOGLOBIN A1C
Hgb A1c MFr Bld: 6.6 % — ABNORMAL HIGH (ref 4.8–5.6)
Mean Plasma Glucose: 143 mg/dL

## 2022-07-07 LAB — GLUCOSE, CAPILLARY: Glucose-Capillary: 119 mg/dL — ABNORMAL HIGH (ref 70–99)

## 2022-07-07 NOTE — Progress Notes (Signed)
Patients PCR swabs from PAT returned positive.  Lorriane Shire at Dr. Adah Salvage office made aware.

## 2022-07-07 NOTE — Progress Notes (Addendum)
PCP - Dr. Deland Pretty Cardiologist - Denies  PPM/ICD - No Pacemaker or defib.  Patient has a spinal cord stimulator.  Requested patient to bring remote with him the day of surgery.   Chest x-ray - n/a EKG - Today during PAT, 07/07/2022 Stress Test - Denies ECHO - Denies Cardiac Cath - Denies  Sleep Study - diagnosed with sleep apnea CPAP - Chooses not to wear one.   History states DM, patient is not on any diabetic medication and has never been diagnosed with diabetes  Last dose of GLP1 agonist-  Denies GLP1 instructions: n/a  Blood Thinner Instructions: Denies Aspirin Instructions:Denies  ERAS Protcol -No, NPO PRE-SURGERY Ensure or G2- No  COVID TEST- Denies  Refusal of blood products signed by patient and faxed to blood bank.    Anesthesia review: Yes.  Reports difficult intubation. HTN.   Patient denies shortness of breath, fever, cough and chest pain at PAT appointment   All instructions explained to the patient, with a verbal understanding of the material. Patient agrees to go over the instructions while at home for a better understanding. Patient also instructed to self quarantine after being tested for COVID-19. The opportunity to ask questions was provided.

## 2022-07-08 DIAGNOSIS — G894 Chronic pain syndrome: Secondary | ICD-10-CM | POA: Diagnosis not present

## 2022-07-08 DIAGNOSIS — M5416 Radiculopathy, lumbar region: Secondary | ICD-10-CM | POA: Diagnosis not present

## 2022-07-08 DIAGNOSIS — Z9689 Presence of other specified functional implants: Secondary | ICD-10-CM | POA: Diagnosis not present

## 2022-07-10 ENCOUNTER — Inpatient Hospital Stay (HOSPITAL_COMMUNITY): Admission: RE | Admit: 2022-07-10 | Payer: Medicare HMO | Source: Ambulatory Visit | Admitting: Neurological Surgery

## 2022-07-10 ENCOUNTER — Encounter (HOSPITAL_COMMUNITY): Admission: RE | Payer: Self-pay | Source: Ambulatory Visit

## 2022-07-10 SURGERY — POSTERIOR LUMBAR FUSION 1 LEVEL
Anesthesia: General | Site: Back

## 2022-07-28 DIAGNOSIS — M5416 Radiculopathy, lumbar region: Secondary | ICD-10-CM | POA: Diagnosis not present

## 2022-08-04 DIAGNOSIS — M5416 Radiculopathy, lumbar region: Secondary | ICD-10-CM | POA: Diagnosis not present

## 2022-08-11 DIAGNOSIS — M5416 Radiculopathy, lumbar region: Secondary | ICD-10-CM | POA: Diagnosis not present

## 2022-08-18 DIAGNOSIS — M5416 Radiculopathy, lumbar region: Secondary | ICD-10-CM | POA: Diagnosis not present

## 2022-08-25 DIAGNOSIS — M5416 Radiculopathy, lumbar region: Secondary | ICD-10-CM | POA: Diagnosis not present

## 2022-08-31 DIAGNOSIS — M5416 Radiculopathy, lumbar region: Secondary | ICD-10-CM | POA: Diagnosis not present

## 2022-09-23 DIAGNOSIS — G894 Chronic pain syndrome: Secondary | ICD-10-CM | POA: Diagnosis not present

## 2022-09-23 DIAGNOSIS — Z981 Arthrodesis status: Secondary | ICD-10-CM | POA: Diagnosis not present

## 2022-09-23 DIAGNOSIS — M5416 Radiculopathy, lumbar region: Secondary | ICD-10-CM | POA: Diagnosis not present

## 2022-09-23 DIAGNOSIS — Z6835 Body mass index (BMI) 35.0-35.9, adult: Secondary | ICD-10-CM | POA: Diagnosis not present

## 2022-10-09 ENCOUNTER — Other Ambulatory Visit: Payer: Self-pay | Admitting: Neurological Surgery

## 2022-10-23 NOTE — Pre-Procedure Instructions (Addendum)
Surgical Instructions    Your procedure is scheduled on November 02, 2022.  Report to [facility] Main Entrance "A" at 10:00 A.M., then check in with the Admitting office.  Call this number if you have problems the morning of surgery:  [phone]  If you have any questions prior to your surgery date call [phone]: Open Monday-Friday 8am-4pm If you experience any cold or flu symptoms such as cough, fever, chills, shortness of breath, etc. between now and your scheduled surgery, please notify us at the above number.     Remember:  Do not eat after midnight the night before your surgery  You may drink clear liquids until 9:00 AM the morning of your surgery.   Clear liquids allowed are: Water, Non-Citrus Juices (without pulp), Carbonated Beverages, Clear Tea, Black Coffee Only (NO MILK, CREAM OR POWDERED CREAMER of any kind), and Gatorade.     Take these medicines the morning of surgery with A SIP OF WATER:  acetaminophen (TYLENOL)   amLODipine (NORVASC)   cetirizine (ZYRTEC)   DULoxetine (CYMBALTA)   metoprolol tartrate (LOPRESSOR)     May take these medicines IF NEEDED:  albuterol (ACCUNEB) nebulizer solution   albuterol (VENTOLIN HFA) inhaler   fluticasone furoate-vilanterol (BREO ELLIPTA)   HYDROcodone-acetaminophen (NORCO)     As of today, STOP taking any Aspirin (unless otherwise instructed by your surgeon) Aleve, Naproxen, Ibuprofen, Motrin, Advil, Goody's, BC's, all herbal medications, fish oil, and all vitamins.    HOW TO MANAGE YOUR DIABETES BEFORE AND AFTER SURGERY  Why is it important to control my blood sugar before and after surgery? Improving blood sugar levels before and after surgery helps healing and can limit problems. A way of improving blood sugar control is eating a healthy diet by:  Eating less sugar and carbohydrates  Increasing activity/exercise  Talking with your doctor about reaching your blood sugar goals High blood sugars (greater than 180  mg/dL) can raise your risk of infections and slow your recovery, so you will need to focus on controlling your diabetes during the weeks before surgery. Make sure that the doctor who takes care of your diabetes knows about your planned surgery including the date and location.  How do I manage my blood sugar before surgery? Check your blood sugar at least 4 times a day, starting 2 days before surgery, to make sure that the level is not too high or low.  Check your blood sugar the morning of your surgery when you wake up and every 2 hours until you get to the Short Stay unit.  If your blood sugar is less than 70 mg/dL, you will need to treat for low blood sugar: Do not take insulin. Treat a low blood sugar (less than 70 mg/dL) with  cup of clear juice (cranberry or apple), 4 glucose tablets, OR glucose gel. Recheck blood sugar in 15 minutes after treatment (to make sure it is greater than 70 mg/dL). If your blood sugar is not greater than 70 mg/dL on recheck, call [phone] for further instructions. Report your blood sugar to the short stay nurse when you get to Short Stay.  If you are admitted to the hospital after surgery: Your blood sugar will be checked by the staff and you will probably be given insulin after surgery (instead of oral diabetes medicines) to make sure you have good blood sugar levels. The goal for blood sugar control after surgery is 80-180 mg/dL.  Do NOT Smoke (Tobacco/Vaping) for 24 hours prior to your procedure.  If you use a CPAP at night, you may bring your mask/headgear for your overnight stay.   Contacts, glasses, piercing's, hearing aid's, dentures or partials may not be worn into surgery, please bring cases for these belongings.    For patients admitted to the hospital, discharge time will be determined by your treatment team.   Patients discharged the day of surgery will not be allowed to drive home, and someone needs to stay with them  for 24 hours.  SURGICAL WAITING ROOM VISITATION Patients having surgery or a procedure may have no more than 2 support people in the waiting area - these visitors may rotate.   Children under the age of 23 must have an adult with them who is not the patient. If the patient needs to stay at the hospital during part of their recovery, the visitor guidelines for inpatient rooms apply. Pre-op nurse will coordinate an appropriate time for 1 support person to accompany patient in pre-op.  This support person may not rotate.   Please refer to the [facility] website for the visitor guidelines for Inpatients (after your surgery is over and you are in a regular room).   If you received a COVID test during your pre-op visit  it is requested that you wear a mask when out in public, stay away from anyone that may not be feeling well and notify your surgeon if you develop symptoms. If you have been in contact with anyone that has tested positive in the last 10 days please notify you surgeon.     Pre-operative 5 CHG Bath Instructions   You can play a key role in reducing the risk of infection after surgery. Your skin needs to be as free of germs as possible. You can reduce the number of germs on your skin by washing with CHG (chlorhexidine gluconate) soap before surgery. CHG is an antiseptic soap that kills germs and continues to kill germs even after washing.   DO NOT use if you have an allergy to chlorhexidine/CHG or antibacterial soaps. If your skin becomes reddened or irritated, stop using the CHG and notify one of our RNs at [phone].   Please shower with the CHG soap starting 4 days before surgery using the following schedule:     Please keep in mind the following:  DO NOT shave, including legs and underarms, starting the day of your first shower.   You may shave your face at any point before/day of surgery.  Place clean sheets on your bed the day you start using CHG soap. Use a clean  washcloth (not used since being washed) for each shower. DO NOT sleep with pets once you start using the CHG.   CHG Shower Instructions:  If you choose to wash your hair and private area, wash first with your normal shampoo/soap.  After you use shampoo/soap, rinse your hair and body thoroughly to remove shampoo/soap residue.  Turn the water OFF and apply about 3 tablespoons (45 ml) of CHG soap to a CLEAN washcloth.  Apply CHG soap ONLY FROM YOUR NECK DOWN TO YOUR TOES (washing for 3-5 minutes)  DO NOT use CHG soap on face, private areas, open wounds, or sores.  Pay special attention to the area where your surgery is being performed.  If you are having back surgery, having someone wash your back for you may be helpful. Wait 2 minutes after CHG soap is applied, then you may  rinse off the CHG soap.  Pat dry with a clean towel  Put on clean clothes/pajamas   If you choose to wear lotion, please use ONLY the CHG-compatible lotions on the back of this paper.     Additional instructions for the day of surgery: DO NOT APPLY any lotions, deodorants, cologne, or perfumes.   Do not wear jewelry or makeup Do not wear nail polish, gel polish, artificial nails, or any other type of covering on natural nails (fingers and toes) Do not bring valuables to the hospital. [facility] is not responsible for any belongings or valuables. Put on clean/comfortable clothes.  Brush your teeth.  Ask your nurse before applying any prescription medications to the skin.      CHG Compatible Lotions   Aveeno Moisturizing lotion  Cetaphil Moisturizing Cream  Cetaphil Moisturizing Lotion  Clairol Herbal Essence Moisturizing Lotion, Dry Skin  Clairol Herbal Essence Moisturizing Lotion, Extra Dry Skin  Clairol Herbal Essence Moisturizing Lotion, Normal Skin  Curel Age Defying Therapeutic Moisturizing Lotion with Alpha Hydroxy  Curel Extreme Care Body Lotion  Curel Soothing Hands Moisturizing Hand Lotion  Curel  Therapeutic Moisturizing Cream, Fragrance-Free  Curel Therapeutic Moisturizing Lotion, Fragrance-Free  Curel Therapeutic Moisturizing Lotion, Original Formula  Eucerin Daily Replenishing Lotion  Eucerin Dry Skin Therapy Plus Alpha Hydroxy Crme  Eucerin Dry Skin Therapy Plus Alpha Hydroxy Lotion  Eucerin Original Crme  Eucerin Original Lotion  Eucerin Plus Crme Eucerin Plus Lotion  Eucerin TriLipid Replenishing Lotion  Keri Anti-Bacterial Hand Lotion  Keri Deep Conditioning Original Lotion Dry Skin Formula Softly Scented  Keri Deep Conditioning Original Lotion, Fragrance Free Sensitive Skin Formula  Keri Lotion Fast Absorbing Fragrance Free Sensitive Skin Formula  Keri Lotion Fast Absorbing Softly Scented Dry Skin Formula  Keri Original Lotion  Keri Skin Renewal Lotion Keri Silky Smooth Lotion  Keri Silky Smooth Sensitive Skin Lotion  Nivea Body Creamy Conditioning Oil  Nivea Body Extra Enriched Lotion  Nivea Body Original Lotion  Nivea Body Sheer Moisturizing Lotion Nivea Crme  Nivea Skin Firming Lotion  NutraDerm 30 Skin Lotion  NutraDerm Skin Lotion  NutraDerm Therapeutic Skin Cream  NutraDerm Therapeutic Skin Lotion  ProShield Protective Hand Cream  Provon moisturizing lotion   Please read over the following fact sheets that you were given.

## 2022-10-26 ENCOUNTER — Other Ambulatory Visit: Payer: Self-pay

## 2022-10-26 ENCOUNTER — Encounter (HOSPITAL_COMMUNITY)
Admission: RE | Admit: 2022-10-26 | Discharge: 2022-10-26 | Disposition: A | Discharge status: Home / Self Care | Payer: Medicare HMO | Source: Ambulatory Visit | Attending: Neurological Surgery | Admitting: Neurological Surgery

## 2022-10-26 ENCOUNTER — Encounter (HOSPITAL_COMMUNITY): Payer: Self-pay

## 2022-10-26 VITALS — BP 152/85 | HR 64 | Temp 98.1°F | Resp 19 | Ht 74.0 in | Wt 267.8 lb

## 2022-10-26 DIAGNOSIS — Z01812 Encounter for preprocedural laboratory examination: Secondary | ICD-10-CM | POA: Insufficient documentation

## 2022-10-26 DIAGNOSIS — I10 Essential (primary) hypertension: Secondary | ICD-10-CM | POA: Insufficient documentation

## 2022-10-26 DIAGNOSIS — G4733 Obstructive sleep apnea (adult) (pediatric): Secondary | ICD-10-CM | POA: Insufficient documentation

## 2022-10-26 DIAGNOSIS — I2510 Atherosclerotic heart disease of native coronary artery without angina pectoris: Secondary | ICD-10-CM | POA: Insufficient documentation

## 2022-10-26 DIAGNOSIS — Z01818 Encounter for other preprocedural examination: Secondary | ICD-10-CM

## 2022-10-26 DIAGNOSIS — E119 Type 2 diabetes mellitus without complications: Secondary | ICD-10-CM | POA: Insufficient documentation

## 2022-10-26 HISTORY — DX: Unspecified osteoarthritis, unspecified site: M19.90

## 2022-10-26 HISTORY — DX: Attention-deficit hyperactivity disorder, unspecified type: F90.9

## 2022-10-26 HISTORY — DX: Respiratory tuberculosis unspecified: A15.9

## 2022-10-26 LAB — BASIC METABOLIC PANEL
Anion gap: 9 (ref 5–15)
BUN: 15 mg/dL (ref 6–20)
CO2: 30 mmol/L (ref 22–32)
Calcium: 9.2 mg/dL (ref 8.9–10.3)
Chloride: 100 mmol/L (ref 98–111)
Creatinine, Ser: 0.97 mg/dL (ref 0.61–1.24)
GFR, Estimated: >60 mL/min (ref >60)
Glucose, Bld: 185 mg/dL — ABNORMAL HIGH (ref 70–99)
Potassium: 3.4 mmol/L — ABNORMAL LOW (ref 3.5–5.1)
Sodium: 139 mmol/L (ref 135–145)

## 2022-10-26 LAB — CBC
HCT: 42.9 % (ref 39.0–52.0)
Hemoglobin: 14.7 g/dL (ref 13.0–17.0)
MCH: 31.6 pg (ref 26.0–34.0)
MCHC: 34.3 g/dL (ref 30.0–36.0)
MCV: 92.3 fL (ref 80.0–100.0)
Platelets: 208 10*3/uL (ref 150–400)
RBC: 4.65 MIL/uL (ref 4.22–5.81)
RDW: 12 % (ref 11.5–15.5)
WBC: 5.4 10*3/uL (ref 4.0–10.5)
nRBC: 0 % (ref 0.0–0.2)

## 2022-10-26 LAB — TYPE AND SCREEN
ABO/RH(D): A POS
Antibody Screen: NEGATIVE

## 2022-10-26 LAB — PROTIME-INR
INR: 0.9 (ref 0.8–1.2)
Prothrombin Time: 12.5 seconds (ref 11.4–15.2)

## 2022-10-26 LAB — SURGICAL PCR SCREEN
MRSA, PCR: POSITIVE — AB
Staphylococcus aureus: POSITIVE — AB

## 2022-10-26 NOTE — Progress Notes (Signed)
PCP - Dr. Merri Brunette Cardiologist - Saw Dr. Prentice Docker 10-11-2017 for CP. Stress Test done and results normal. No follow-up needed  PPM/ICD - Denies Device Orders - n/a Rep Notified - n/a  Chest x-ray - Denies EKG - 07/07/2022 Stress Test - 11/12/2017 ECHO - Denies Cardiac Cath - Denies Cardiac CT - 12/03/2021  Sleep Study - +OSA but patient does not wear CPAP due to "not wanting chemicals from mask in his mouth"  Pt denies DM, but last A1c was 6.24 June 2022. A1c redrawn today. Result pending.  Last dose of GLP1 agonist- n/a GLP1 instructions: n/a  Blood Thinner Instructions: n/a Aspirin Instructions: n/a  ERAS Protcol - Clear liquids until 0900 morning of surgery PRE-SURGERY Ensure or G2- n/a  COVID TEST- n/a   Anesthesia review: Yes. Pt flagged for difficult intubation. Discussed with Antionette Poles, PA-C    Patient denies shortness of breath, fever, cough and chest pain at PAT appointment. Pt denies any respiratory illness/infection in the last two months.   All instructions explained to the patient, with a verbal understanding of the material. Patient agrees to go over the instructions while at home for a better understanding. Patient also instructed to self quarantine after being tested for COVID-19. The opportunity to ask questions was provided.

## 2022-10-26 NOTE — Progress Notes (Signed)
Pts surgical PCR positive for MRSA and MSSA. Attempted to call [place] Noe, Dr. Yetta Barre OR scheduler, but no answer. Voicemail left with patients information, surgery date, and lab result. Call back number given as well.

## 2022-10-27 LAB — HEMOGLOBIN A1C
Hgb A1c MFr Bld: 6.5 % — ABNORMAL HIGH (ref 4.8–5.6)
Mean Plasma Glucose: 140 mg/dL

## 2022-10-27 NOTE — Anesthesia Preprocedure Evaluation (Addendum)
Anesthesia Evaluation  Patient identified by MRN, date of birth, ID band Patient awake    Reviewed: Allergy & Precautions, H&P , NPO status , Patient's Chart, lab work & pertinent test results  History of Anesthesia Complications (+) DIFFICULT AIRWAY and history of anesthetic complications  Airway Mallampati: II   Neck ROM: full    Dental   Pulmonary asthma , sleep apnea    breath sounds clear to auscultation       Cardiovascular hypertension,  Rhythm:regular Rate:Normal     Neuro/Psych  Headaches PSYCHIATRIC DISORDERS  Depression     Neuromuscular disease    GI/Hepatic   Endo/Other    Renal/GU      Musculoskeletal  (+) Arthritis ,    Abdominal   Peds  Hematology   Anesthesia Other Findings   Reproductive/Obstetrics                             Anesthesia Physical Anesthesia Plan  ASA: 2  Anesthesia Plan: General   Post-op Pain Management:    Induction: Intravenous  PONV Risk Score and Plan: 2 and Ondansetron, Dexamethasone, Midazolam and Treatment may vary due to age or medical condition  Airway Management Planned: Video Laryngoscope Planned  Additional Equipment:   Intra-op Plan:   Post-operative Plan: Extubation in OR  Informed Consent: I have reviewed the patients History and Physical, chart, labs and discussed the procedure including the risks, benefits and alternatives for the proposed anesthesia with the patient or authorized representative who has indicated his/her understanding and acceptance.     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  Difficult intubation listed in pt history. Pt  unaware of any previous intubation complications, does not know why this was added. It appears to have been added to his history on 04/28/21 for unclear reasons. Available anesthesia records reviewed. Pt had removal of laryngeal and uvular papilloma at [facility] 09/14/14. Anesthesia commented that he did not have history of difficult intubation, but glidescope was used electively. Pt had laparoscopic sleeve gastrectomy  09/18/2015 and lumbar surgery 09/18/15 at [facility] and on both occassions was documented as not having a difficult airway, however, glidescope was used electively for both procedures.   Diet controlled DM2, A1c 6.5 on preop labs.   Preop labs reviewed, unremarkable.   EKG 07/07/22: Normal sinus rhythm. Rate 71. Left posterior fascicular block. RSR' or QR pattern in V1 suggests right ventricular conduction delay  Nuclear stress 11/12/17: ? There was no ST segment deviation noted during stress. ? This is a low risk study. ? The left ventricular ejection fraction is normal (55-65%). ? 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 Cannot completely exclude prior infarct with mild peri-infarct ischemia. Either finding would support low risk for major cardiac events  )        Anesthesia Quick Evaluation

## 2022-10-27 NOTE — Progress Notes (Signed)
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  Difficult intubation listed in pt history. Pt unaware of any previous intubation complications, does not know why this was added. It appears to have been added to his history on 04/28/21 for unclear reasons. Available anesthesia records reviewed. Pt had removal of laryngeal and uvular papilloma at [facility] 09/14/14. Anesthesia commented that he did not have history of difficult intubation, but glidescope was used electively. Pt had laparoscopic sleeve gastrectomy  09/18/2015 and lumbar surgery 09/18/15 at [facility] and on both occassions was documented as not having a difficult airway, however, glidescope was used electively for both procedures.   Diet controlled DM2, A1c 6.5 on preop labs.   Preop labs reviewed, unremarkable.   EKG 07/07/22: Normal sinus rhythm. Rate 71. Left posterior fascicular block. RSR' or QR pattern in V1 suggests right ventricular conduction delay  Nuclear stress 11/12/17: There was no ST segment deviation noted during stress. This is a low risk study. The left ventricular ejection fraction is normal (55-65%). 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 Cannot completely exclude prior infarct with mild peri-infarct ischemia. Either finding would support low risk for major cardiac events   Zannie Cove [facility]/Anesthesiology Phone [phone] 10/27/2022 11:23 AM

## 2022-11-02 ENCOUNTER — Inpatient Hospital Stay (HOSPITAL_COMMUNITY)
Admission: RE | Admit: 2022-11-02 | Discharge: 2022-11-03 | DRG: 455 | Disposition: A | Discharge status: Home / Self Care | Payer: Medicare HMO | Attending: Neurological Surgery | Admitting: Neurological Surgery

## 2022-11-02 ENCOUNTER — Encounter (HOSPITAL_COMMUNITY)
Admission: RE | Disposition: A | Discharge status: Home / Self Care | Payer: Self-pay | Source: Home / Self Care | Attending: Neurological Surgery

## 2022-11-02 ENCOUNTER — Other Ambulatory Visit: Payer: Self-pay

## 2022-11-02 ENCOUNTER — Inpatient Hospital Stay (HOSPITAL_COMMUNITY): Payer: Medicare HMO | Admitting: Certified Registered Nurse Anesthetist

## 2022-11-02 ENCOUNTER — Inpatient Hospital Stay (HOSPITAL_COMMUNITY): Payer: Medicare HMO

## 2022-11-02 ENCOUNTER — Inpatient Hospital Stay (HOSPITAL_COMMUNITY): Payer: Medicare HMO | Admitting: Physician Assistant

## 2022-11-02 ENCOUNTER — Encounter (HOSPITAL_COMMUNITY): Payer: Self-pay | Admitting: Neurological Surgery

## 2022-11-02 DIAGNOSIS — M48061 Spinal stenosis, lumbar region without neurogenic claudication: Principal | ICD-10-CM | POA: Diagnosis present

## 2022-11-02 DIAGNOSIS — Z7951 Long term (current) use of inhaled steroids: Secondary | ICD-10-CM | POA: Diagnosis not present

## 2022-11-02 DIAGNOSIS — Z8611 Personal history of tuberculosis: Secondary | ICD-10-CM | POA: Diagnosis not present

## 2022-11-02 DIAGNOSIS — Z96643 Presence of artificial hip joint, bilateral: Secondary | ICD-10-CM | POA: Diagnosis not present

## 2022-11-02 DIAGNOSIS — G4730 Sleep apnea, unspecified: Secondary | ICD-10-CM | POA: Diagnosis not present

## 2022-11-02 DIAGNOSIS — I10 Essential (primary) hypertension: Secondary | ICD-10-CM | POA: Diagnosis present

## 2022-11-02 DIAGNOSIS — Z808 Family history of malignant neoplasm of other organs or systems: Secondary | ICD-10-CM | POA: Diagnosis not present

## 2022-11-02 DIAGNOSIS — Z79899 Other long term (current) drug therapy: Secondary | ICD-10-CM

## 2022-11-02 DIAGNOSIS — Z8249 Family history of ischemic heart disease and other diseases of the circulatory system: Secondary | ICD-10-CM | POA: Diagnosis not present

## 2022-11-02 DIAGNOSIS — Z9682 Presence of neurostimulator: Secondary | ICD-10-CM

## 2022-11-02 DIAGNOSIS — M109 Gout, unspecified: Secondary | ICD-10-CM | POA: Diagnosis not present

## 2022-11-02 DIAGNOSIS — E739 Lactose intolerance, unspecified: Secondary | ICD-10-CM | POA: Diagnosis present

## 2022-11-02 DIAGNOSIS — Z981 Arthrodesis status: Secondary | ICD-10-CM | POA: Diagnosis not present

## 2022-11-02 DIAGNOSIS — M4316 Spondylolisthesis, lumbar region: Secondary | ICD-10-CM | POA: Diagnosis present

## 2022-11-02 DIAGNOSIS — Z9884 Bariatric surgery status: Secondary | ICD-10-CM | POA: Diagnosis not present

## 2022-11-02 DIAGNOSIS — M1990 Unspecified osteoarthritis, unspecified site: Secondary | ICD-10-CM | POA: Diagnosis not present

## 2022-11-02 DIAGNOSIS — F909 Attention-deficit hyperactivity disorder, unspecified type: Secondary | ICD-10-CM | POA: Diagnosis not present

## 2022-11-02 DIAGNOSIS — Z85828 Personal history of other malignant neoplasm of skin: Secondary | ICD-10-CM

## 2022-11-02 LAB — ABO/RH: ABO/RH(D): A POS

## 2022-11-02 LAB — GLUCOSE, CAPILLARY: Glucose-Capillary: 126 mg/dL — ABNORMAL HIGH (ref 70–99)

## 2022-11-02 SURGERY — POSTERIOR LUMBAR FUSION 1 LEVEL
Anesthesia: General | Site: Back

## 2022-11-02 MED ORDER — MENTHOL 3 MG MT LOZG: 1.0000 | LOZENGE | OROMUCOSAL | Status: DC | PRN

## 2022-11-02 MED ORDER — AMLODIPINE BESYLATE 10 MG PO TABS: 10.0000 mg | ORAL_TABLET | Freq: Every day | ORAL | Status: DC

## 2022-11-02 MED ORDER — THROMBIN 20000 UNITS EX SOLR
CUTANEOUS | Status: DC | PRN
  Administered 2022-11-02: 20 mL via TOPICAL

## 2022-11-02 MED ORDER — SODIUM CHLORIDE 0.9% FLUSH: 3.0000 mL | Freq: Two times a day (BID) | INTRAVENOUS | Status: DC

## 2022-11-02 MED ORDER — KETAMINE HCL 10 MG/ML IJ SOLN
INTRAMUSCULAR | Status: DC | PRN
  Administered 2022-11-02: 5 mg via INTRAVENOUS
  Administered 2022-11-02: 25 mg via INTRAVENOUS
  Administered 2022-11-02 (×2): 10 mg via INTRAVENOUS

## 2022-11-02 MED ORDER — LIDOCAINE 2% (20 MG/ML) 5 ML SYRINGE
INTRAMUSCULAR | Status: AC
  Filled 2022-11-02: qty 5

## 2022-11-02 MED ORDER — ONDANSETRON HCL 4 MG/2ML IJ SOLN: 4.0000 mg | Freq: Four times a day (QID) | INTRAMUSCULAR | Status: DC | PRN

## 2022-11-02 MED ORDER — SURGIRINSE WOUND IRRIGATION SYSTEM - OPTIME
TOPICAL | Status: DC | PRN
  Administered 2022-11-02: 900 mL via TOPICAL

## 2022-11-02 MED ORDER — SENNA 8.6 MG PO TABS
1.0000 | ORAL_TABLET | Freq: Two times a day (BID) | ORAL | Status: DC
  Administered 2022-11-02 – 2022-11-03 (×2): 8.6 mg via ORAL
  Filled 2022-11-02 (×2): qty 1

## 2022-11-02 MED ORDER — HYDROCODONE-ACETAMINOPHEN 7.5-325 MG PO TABS
1.0000 | ORAL_TABLET | ORAL | Status: DC | PRN
  Administered 2022-11-02 – 2022-11-03 (×4): 1 via ORAL
  Filled 2022-11-02 (×4): qty 1

## 2022-11-02 MED ORDER — ORAL CARE MOUTH RINSE: 15.0000 mL | Freq: Once | OROMUCOSAL | Status: AC

## 2022-11-02 MED ORDER — THROMBIN 5000 UNITS EX SOLR
CUTANEOUS | Status: AC
  Filled 2022-11-02: qty 5000

## 2022-11-02 MED ORDER — ONDANSETRON HCL 4 MG PO TABS: 4.0000 mg | ORAL_TABLET | Freq: Four times a day (QID) | ORAL | Status: DC | PRN

## 2022-11-02 MED ORDER — GLYCOPYRROLATE PF 0.2 MG/ML IJ SOSY
PREFILLED_SYRINGE | INTRAMUSCULAR | Status: DC | PRN
  Administered 2022-11-02 (×2): .1 mg via INTRAVENOUS

## 2022-11-02 MED ORDER — ALBUTEROL SULFATE 0.63 MG/3ML IN NEBU: 1.0000 | INHALATION_SOLUTION | Freq: Four times a day (QID) | RESPIRATORY_TRACT | Status: DC | PRN

## 2022-11-02 MED ORDER — ACETAMINOPHEN 325 MG PO TABS: 650.0000 mg | ORAL_TABLET | ORAL | Status: DC | PRN

## 2022-11-02 MED ORDER — MIDAZOLAM HCL 2 MG/2ML IJ SOLN
INTRAMUSCULAR | Status: AC
  Filled 2022-11-02: qty 2

## 2022-11-02 MED ORDER — IRBESARTAN 75 MG PO TABS: 75.0000 mg | ORAL_TABLET | Freq: Every day | ORAL | Status: DC

## 2022-11-02 MED ORDER — DEXAMETHASONE SODIUM PHOSPHATE 4 MG/ML IJ SOLN
4.0000 mg | Freq: Four times a day (QID) | INTRAMUSCULAR | Status: DC
  Administered 2022-11-02: 4 mg via INTRAVENOUS
  Filled 2022-11-02: qty 1

## 2022-11-02 MED ORDER — LIDOCAINE 2% (20 MG/ML) 5 ML SYRINGE
INTRAMUSCULAR | Status: DC | PRN
  Administered 2022-11-02: 60 mg via INTRAVENOUS

## 2022-11-02 MED ORDER — CEFAZOLIN SODIUM-DEXTROSE 2-4 GM/100ML-% IV SOLN
2.0000 g | Freq: Three times a day (TID) | INTRAVENOUS | Status: AC
  Administered 2022-11-02 – 2022-11-03 (×2): 2 g via INTRAVENOUS
  Filled 2022-11-02 (×2): qty 100

## 2022-11-02 MED ORDER — METOPROLOL TARTRATE 50 MG PO TABS: 100.0000 mg | ORAL_TABLET | Freq: Every day | ORAL | Status: DC

## 2022-11-02 MED ORDER — VITAMIN B-12 1000 MCG PO TABS: 1000.0000 ug | ORAL_TABLET | Freq: Every day | ORAL | Status: DC

## 2022-11-02 MED ORDER — LACTATED RINGERS IV SOLN: INTRAVENOUS | Status: DC | PRN

## 2022-11-02 MED ORDER — ACETAMINOPHEN 650 MG RE SUPP: 650.0000 mg | RECTAL | Status: DC | PRN

## 2022-11-02 MED ORDER — THROMBIN 20000 UNITS EX SOLR
CUTANEOUS | Status: AC
  Filled 2022-11-02: qty 20000

## 2022-11-02 MED ORDER — HYDROMORPHONE HCL 1 MG/ML IJ SOLN
0.5000 mg | INTRAMUSCULAR | Status: DC | PRN
  Administered 2022-11-02: 0.5 mg via INTRAVENOUS
  Filled 2022-11-02: qty 0.5

## 2022-11-02 MED ORDER — PHENYLEPHRINE HCL-NACL 20-0.9 MG/250ML-% IV SOLN
INTRAVENOUS | Status: DC | PRN
  Administered 2022-11-02: 20 ug/min via INTRAVENOUS

## 2022-11-02 MED ORDER — FENTANYL CITRATE (PF) 250 MCG/5ML IJ SOLN
INTRAMUSCULAR | Status: AC
  Filled 2022-11-02: qty 5

## 2022-11-02 MED ORDER — METOPROLOL TARTRATE 50 MG PO TABS: 50.0000 mg | ORAL_TABLET | ORAL | Status: DC

## 2022-11-02 MED ORDER — BUPIVACAINE HCL (PF) 0.25 % IJ SOLN
INTRAMUSCULAR | Status: AC
  Filled 2022-11-02: qty 30

## 2022-11-02 MED ORDER — MIDAZOLAM HCL 2 MG/2ML IJ SOLN
INTRAMUSCULAR | Status: DC | PRN
  Administered 2022-11-02: 2 mg via INTRAVENOUS

## 2022-11-02 MED ORDER — SODIUM CHLORIDE 0.9% FLUSH: 3.0000 mL | INTRAVENOUS | Status: DC | PRN

## 2022-11-02 MED ORDER — CHLORHEXIDINE GLUCONATE CLOTH 2 % EX PADS: 6.0000 | MEDICATED_PAD | Freq: Once | CUTANEOUS | Status: DC

## 2022-11-02 MED ORDER — CELECOXIB 200 MG PO CAPS
200.0000 mg | ORAL_CAPSULE | Freq: Two times a day (BID) | ORAL | Status: DC
  Administered 2022-11-02 – 2022-11-03 (×2): 200 mg via ORAL
  Filled 2022-11-02 (×2): qty 1

## 2022-11-02 MED ORDER — ALBUTEROL SULFATE HFA 108 (90 BASE) MCG/ACT IN AERS: 1.0000 | INHALATION_SPRAY | Freq: Four times a day (QID) | RESPIRATORY_TRACT | Status: DC | PRN

## 2022-11-02 MED ORDER — HYDROCHLOROTHIAZIDE 25 MG PO TABS: 25.0000 mg | ORAL_TABLET | Freq: Every day | ORAL | Status: DC

## 2022-11-02 MED ORDER — METOPROLOL TARTRATE 50 MG PO TABS
50.0000 mg | ORAL_TABLET | Freq: Every day | ORAL | Status: DC
  Administered 2022-11-02: 50 mg via ORAL
  Filled 2022-11-02: qty 1

## 2022-11-02 MED ORDER — DEXAMETHASONE SODIUM PHOSPHATE 10 MG/ML IJ SOLN
INTRAMUSCULAR | Status: AC
  Filled 2022-11-02: qty 1

## 2022-11-02 MED ORDER — EPHEDRINE SULFATE-NACL 50-0.9 MG/10ML-% IV SOSY
PREFILLED_SYRINGE | INTRAVENOUS | Status: DC | PRN
  Administered 2022-11-02: 2.25 mg via INTRAVENOUS
  Administered 2022-11-02: 2.5 mg via INTRAVENOUS
  Administered 2022-11-02 (×4): 5 mg via INTRAVENOUS

## 2022-11-02 MED ORDER — LACTATED RINGERS IV SOLN: INTRAVENOUS | Status: DC

## 2022-11-02 MED ORDER — METHOCARBAMOL 1000 MG/10ML IJ SOLN: 500.0000 mg | Freq: Four times a day (QID) | INTRAVENOUS | Status: DC | PRN

## 2022-11-02 MED ORDER — ROCURONIUM BROMIDE 10 MG/ML (PF) SYRINGE
PREFILLED_SYRINGE | INTRAVENOUS | Status: AC
  Filled 2022-11-02: qty 10

## 2022-11-02 MED ORDER — FENTANYL CITRATE (PF) 250 MCG/5ML IJ SOLN
INTRAMUSCULAR | Status: DC | PRN
  Administered 2022-11-02 (×5): 50 ug via INTRAVENOUS

## 2022-11-02 MED ORDER — DULOXETINE HCL 30 MG PO CPEP: 60.0000 mg | ORAL_CAPSULE | Freq: Every day | ORAL | Status: DC

## 2022-11-02 MED ORDER — FENTANYL CITRATE (PF) 100 MCG/2ML IJ SOLN
INTRAMUSCULAR | Status: AC
  Filled 2022-11-02: qty 2

## 2022-11-02 MED ORDER — ROCURONIUM BROMIDE 10 MG/ML (PF) SYRINGE
PREFILLED_SYRINGE | INTRAVENOUS | Status: DC | PRN
  Administered 2022-11-02: 60 mg via INTRAVENOUS
  Administered 2022-11-02: 20 mg via INTRAVENOUS
  Administered 2022-11-02: 40 mg via INTRAVENOUS
  Administered 2022-11-02: 20 mg via INTRAVENOUS

## 2022-11-02 MED ORDER — PROPOFOL 10 MG/ML IV BOLUS
INTRAVENOUS | Status: AC
  Filled 2022-11-02: qty 20

## 2022-11-02 MED ORDER — POTASSIUM CHLORIDE IN NACL 20-0.9 MEQ/L-% IV SOLN: INTRAVENOUS | Status: DC

## 2022-11-02 MED ORDER — METHOCARBAMOL 500 MG PO TABS
500.0000 mg | ORAL_TABLET | Freq: Four times a day (QID) | ORAL | Status: DC | PRN
  Administered 2022-11-02 – 2022-11-03 (×2): 500 mg via ORAL
  Filled 2022-11-02 (×2): qty 1

## 2022-11-02 MED ORDER — ONDANSETRON HCL 4 MG/2ML IJ SOLN
INTRAMUSCULAR | Status: AC
  Filled 2022-11-02: qty 2

## 2022-11-02 MED ORDER — VANCOMYCIN HCL 1500 MG/300ML IV SOLN
1500.0000 mg | INTRAVENOUS | Status: AC
  Administered 2022-11-02: 1500 mg via INTRAVENOUS
  Filled 2022-11-02: qty 300

## 2022-11-02 MED ORDER — FENTANYL CITRATE (PF) 100 MCG/2ML IJ SOLN
25.0000 ug | INTRAMUSCULAR | Status: DC | PRN
  Administered 2022-11-02 (×2): 50 ug via INTRAVENOUS

## 2022-11-02 MED ORDER — PROPOFOL 10 MG/ML IV BOLUS
INTRAVENOUS | Status: DC | PRN
Start: 2022-11-02 — End: 2022-11-02
  Administered 2022-11-02: 50 mg via INTRAVENOUS
  Administered 2022-11-02: 150 mg via INTRAVENOUS

## 2022-11-02 MED ORDER — FLUTICASONE FUROATE-VILANTEROL 100-25 MCG/ACT IN AEPB: 1.0000 | INHALATION_SPRAY | Freq: Every day | RESPIRATORY_TRACT | Status: DC | PRN

## 2022-11-02 MED ORDER — TRAZODONE HCL 50 MG PO TABS
50.0000 mg | ORAL_TABLET | Freq: Every day | ORAL | Status: DC
  Administered 2022-11-02: 50 mg via ORAL
  Filled 2022-11-02: qty 1

## 2022-11-02 MED ORDER — OXYCODONE HCL 5 MG/5ML PO SOLN: 5.0000 mg | Freq: Once | ORAL | Status: DC | PRN

## 2022-11-02 MED ORDER — ACETAMINOPHEN 500 MG PO TABS
1000.0000 mg | ORAL_TABLET | ORAL | Status: DC
  Filled 2022-11-02: qty 2

## 2022-11-02 MED ORDER — 0.9 % SODIUM CHLORIDE (POUR BTL) OPTIME
TOPICAL | Status: DC | PRN
  Administered 2022-11-02: 1000 mL

## 2022-11-02 MED ORDER — SUGAMMADEX SODIUM 200 MG/2ML IV SOLN
INTRAVENOUS | Status: DC | PRN
  Administered 2022-11-02: 150 mg via INTRAVENOUS

## 2022-11-02 MED ORDER — KETAMINE HCL 50 MG/5ML IJ SOSY
PREFILLED_SYRINGE | INTRAMUSCULAR | Status: AC
  Filled 2022-11-02: qty 5

## 2022-11-02 MED ORDER — CHLORHEXIDINE GLUCONATE 0.12 % MT SOLN
15.0000 mL | Freq: Once | OROMUCOSAL | Status: AC
  Administered 2022-11-02: 15 mL via OROMUCOSAL
  Filled 2022-11-02: qty 15

## 2022-11-02 MED ORDER — SODIUM CHLORIDE 0.9 % IV SOLN
250.0000 mL | INTRAVENOUS | Status: DC
  Administered 2022-11-02: 250 mL via INTRAVENOUS

## 2022-11-02 MED ORDER — EPHEDRINE 5 MG/ML INJ
INTRAVENOUS | Status: AC
  Filled 2022-11-02: qty 5

## 2022-11-02 MED ORDER — ALBUMIN HUMAN 5 % IV SOLN: INTRAVENOUS | Status: DC | PRN

## 2022-11-02 MED ORDER — OXYCODONE HCL 5 MG PO TABS: 5.0000 mg | ORAL_TABLET | Freq: Once | ORAL | Status: DC | PRN

## 2022-11-02 MED ORDER — GABAPENTIN 300 MG PO CAPS
300.0000 mg | ORAL_CAPSULE | ORAL | Status: AC
  Administered 2022-11-02: 300 mg via ORAL
  Filled 2022-11-02: qty 1

## 2022-11-02 MED ORDER — BUPIVACAINE HCL (PF) 0.25 % IJ SOLN
INTRAMUSCULAR | Status: DC | PRN
  Administered 2022-11-02: 6 mL

## 2022-11-02 MED ORDER — CEFAZOLIN IN SODIUM CHLORIDE 3-0.9 GM/100ML-% IV SOLN
3.0000 g | INTRAVENOUS | Status: AC
  Administered 2022-11-02: 3 g via INTRAVENOUS
  Filled 2022-11-02: qty 100

## 2022-11-02 MED ORDER — THROMBIN 5000 UNITS EX SOLR
OROMUCOSAL | Status: DC | PRN
  Administered 2022-11-02: 5 mL via TOPICAL

## 2022-11-02 MED ORDER — PHENOL 1.4 % MT LIQD: 1.0000 | OROMUCOSAL | Status: DC | PRN

## 2022-11-02 MED ORDER — DEXAMETHASONE 4 MG PO TABS
4.0000 mg | ORAL_TABLET | Freq: Four times a day (QID) | ORAL | Status: DC
  Administered 2022-11-03 (×2): 4 mg via ORAL
  Filled 2022-11-02 (×2): qty 1

## 2022-11-02 MED ORDER — PHENYLEPHRINE 80 MCG/ML (10ML) SYRINGE FOR IV PUSH (FOR BLOOD PRESSURE SUPPORT)
PREFILLED_SYRINGE | INTRAVENOUS | Status: AC
  Filled 2022-11-02: qty 10

## 2022-11-02 MED ORDER — ONDANSETRON HCL 4 MG/2ML IJ SOLN
INTRAMUSCULAR | Status: DC | PRN
  Administered 2022-11-02: 4 mg via INTRAVENOUS

## 2022-11-02 MED ORDER — DEXAMETHASONE SODIUM PHOSPHATE 10 MG/ML IJ SOLN
INTRAMUSCULAR | Status: DC | PRN
  Administered 2022-11-02: 5 mg via INTRAVENOUS

## 2022-11-02 SURGICAL SUPPLY — 67 items
ADH SKN CLS APL DERMABOND .7 (GAUZE/BANDAGES/DRESSINGS) ×1
APL SKNCLS STERI-STRIP NONHPOA (GAUZE/BANDAGES/DRESSINGS) ×1
BAG COUNTER SPONGE SURGICOUNT (BAG) ×2 IMPLANT
BAG SPNG CNTER NS LX DISP (BAG) ×1
BASKET BONE COLLECTION (BASKET) ×2 IMPLANT
BENZOIN TINCTURE PRP APPL 2/3 (GAUZE/BANDAGES/DRESSINGS) ×2 IMPLANT
BLADE BONE MILL MEDIUM (MISCELLANEOUS) ×2 IMPLANT
BLADE CLIPPER SURG (BLADE) IMPLANT
BONE FIBERS PLIAFX 5ML (Bone Implant) ×1 IMPLANT
BUR CARBIDE MATCH 3.0 (BURR) ×2 IMPLANT
CANISTER SUCT 3000ML PPV (MISCELLANEOUS) ×2 IMPLANT
CNTNR URN SCR LID CUP LEK RST (MISCELLANEOUS) ×2 IMPLANT
CONT SPEC 4OZ STRL OR WHT (MISCELLANEOUS) ×1
COVER BACK TABLE 60X90IN (DRAPES) ×2 IMPLANT
DERMABOND ADVANCED .7 DNX12 (GAUZE/BANDAGES/DRESSINGS) ×2 IMPLANT
DRAPE C-ARM 42X72 X-RAY (DRAPES) ×4 IMPLANT
DRAPE C-ARMOR (DRAPES) ×2 IMPLANT
DRAPE LAPAROTOMY 100X72X124 (DRAPES) ×2 IMPLANT
DRAPE SURG 17X23 STRL (DRAPES) ×2 IMPLANT
DRSG OPSITE POSTOP 4X6 (GAUZE/BANDAGES/DRESSINGS) IMPLANT
DURAPREP 26ML APPLICATOR (WOUND CARE) ×2 IMPLANT
ELECT REM PT RETURN 9FT ADLT (ELECTROSURGICAL) ×1
ELECTRODE REM PT RTRN 9FT ADLT (ELECTROSURGICAL) ×2 IMPLANT
EVACUATOR 1/8 PVC DRAIN (DRAIN) ×2 IMPLANT
GAUZE 4X4 16PLY ~~LOC~~+RFID DBL (SPONGE) IMPLANT
GLOVE BIO SURGEON STRL SZ7 (GLOVE) IMPLANT
GLOVE BIO SURGEON STRL SZ8 (GLOVE) ×4 IMPLANT
GLOVE BIOGEL PI IND STRL 7.0 (GLOVE) IMPLANT
GLOVE BIOGEL PI IND STRL 7.5 (GLOVE) IMPLANT
GOWN STRL REUS W/ TWL LRG LVL3 (GOWN DISPOSABLE) IMPLANT
GOWN STRL REUS W/ TWL XL LVL3 (GOWN DISPOSABLE) ×4 IMPLANT
GOWN STRL REUS W/TWL 2XL LVL3 (GOWN DISPOSABLE) IMPLANT
GOWN STRL REUS W/TWL LRG LVL3 (GOWN DISPOSABLE)
GOWN STRL REUS W/TWL XL LVL3 (GOWN DISPOSABLE) ×3
GRAFT BNE FBR PLIAFX PRIME 5 (Bone Implant) IMPLANT
HEMOSTAT POWDER KIT SURGIFOAM (HEMOSTASIS) ×2 IMPLANT
KIT BASIN OR (CUSTOM PROCEDURE TRAY) ×2 IMPLANT
KIT GRAFTMAG DEL NEURO DISP (NEUROSURGERY SUPPLIES) IMPLANT
KIT POSITION SURG JACKSON T1 (MISCELLANEOUS) ×2 IMPLANT
KIT TURNOVER KIT B (KITS) ×2 IMPLANT
MILL BONE PREP (MISCELLANEOUS) ×2 IMPLANT
NDL HYPO 25X1 1.5 SAFETY (NEEDLE) ×2 IMPLANT
NEEDLE HYPO 25X1 1.5 SAFETY (NEEDLE) ×1 IMPLANT
NS IRRIG 1000ML POUR BTL (IV SOLUTION) ×2 IMPLANT
PACK LAMINECTOMY NEURO (CUSTOM PROCEDURE TRAY) ×2 IMPLANT
PAD ARMBOARD 7.5X6 YLW CONV (MISCELLANEOUS) ×6 IMPLANT
PUTTY DBM 5CC (Putty) IMPLANT
ROD LORD LIPPED TI 5.5X45 (Rod) IMPLANT
SCREW KODIAK 6.5X45 (Screw) IMPLANT
SCREW KODIAK 6.5X50MM (Screw) IMPLANT
SET SCREW (Screw) ×4 IMPLANT
SET SCREW SPNE (Screw) IMPLANT
SOL ELECTROSURG ANTI STICK (MISCELLANEOUS) ×1
SOLUTION ELECTROSURG ANTI STCK (MISCELLANEOUS) ×2 IMPLANT
SOLUTION IRRIG SURGIPHOR (IV SOLUTION) ×2 IMPLANT
SPACER IDENTITI PS 9X9X25 15D (Spacer) IMPLANT
SPONGE SURGIFOAM ABS GEL 100 (HEMOSTASIS) ×2 IMPLANT
SPONGE T-LAP 4X18 ~~LOC~~+RFID (SPONGE) IMPLANT
STRIP CLOSURE SKIN 1/2X4 (GAUZE/BANDAGES/DRESSINGS) ×4 IMPLANT
SUT VIC AB 0 CT1 18XCR BRD8 (SUTURE) ×2 IMPLANT
SUT VIC AB 0 CT1 8-18 (SUTURE) ×1
SUT VIC AB 2-0 CP2 18 (SUTURE) ×2 IMPLANT
SUT VIC AB 3-0 SH 8-18 (SUTURE) ×4 IMPLANT
TOWEL GREEN STERILE (TOWEL DISPOSABLE) ×2 IMPLANT
TOWEL GREEN STERILE FF (TOWEL DISPOSABLE) ×2 IMPLANT
TRAY FOLEY MTR SLVR 16FR STAT (SET/KITS/TRAYS/PACK) ×2 IMPLANT
WATER STERILE IRR 1000ML POUR (IV SOLUTION) ×2 IMPLANT

## 2022-11-02 NOTE — H&P (Signed)
Subjective: Patient is a 62 y.o. male admitted for back and leg pain. Onset of symptoms was several months ago, gradually worsening since that time.  The pain is rated severe, and is located at the across the lower back and radiates to legs. The pain is described as aching and occurs all day. The symptoms have been progressive. Symptoms are exacerbated by exercise and standing. MRI or CT showed adjacent level stenosis L2-3   Past Medical History:  Diagnosis Date   ADHD (attention deficit hyperactivity disorder)    Arthritis    Asthma    Cancer (HCC) 2023   Skin cancer with removal on back   Complication of anesthesia    difficult to wake up   Difficult intubation    Gout    Headache    Hypertension    Lactose intolerance    Sleep apnea    uses CPAP   Tuberculosis    tested positive in [place]. One year treatment    Past Surgical History:  Procedure Laterality Date   back surgeries     COLONOSCOPY  07/16/2015   Mass; Three 3-4 mm polyps resected and retrieved, otherwise normal exam.  Recommended repeat colonoscopy in 3-5 years.  Pathology revealed 2 tubular adenomas and benign polypoid colonic mucosa.   COLONOSCOPY WITH PROPOFOL N/A 05/01/2021   Procedure: COLONOSCOPY WITH PROPOFOL;  Surgeon: Corbin Ade, MD;  [facility];  Service: Endoscopy;  Laterality: N/A;  8:15am ASA 2   EXCISION MASS UPPER EXTREMETIES Right 11/18/2017   Procedure: RIGHT RING FINGER EXCISION MASS AND DEBRIDEMENT  PROXIMAL INTERPHALANGEAL JOINT;  Surgeon: Cindee Salt, MD;  Location: [facility];  Service: Orthopedics;  Laterality: Right;  right ring finger   LAMINECTOMY  09/2020   T spine   LAPAROSCOPIC GASTRIC SLEEVE RESECTION     NASAL SINUS SURGERY     POLYPECTOMY  05/01/2021   Procedure: POLYPECTOMY;  Surgeon: Corbin Ade, MD;  Location: [facility];  Service: Endoscopy;;   SPINAL CORD STIMULATOR IMPLANT  09/2020   TONSILLECTOMY AND ADENOIDECTOMY     TOTAL HIP ARTHROPLASTY  Left 02/2021   TOTAL HIP ARTHROPLASTY Right 03/2021   tumor removal off vocal cords     benign per patient    Prior to Admission medications   Medication Sig Start Date End Date Taking? Authorizing Provider  acetaminophen (TYLENOL) 500 MG tablet Take 500 mg by mouth in the morning and at bedtime.   Yes [provider]  albuterol (VENTOLIN HFA) 108 (90 Base) MCG/ACT inhaler Inhale 1-2 puffs into the lungs every 6 (six) hours as needed for wheezing or shortness of breath.   Yes [provider]  amLODipine (NORVASC) 10 MG tablet Take 10 mg by mouth daily.   Yes [provider]  cetirizine (ZYRTEC) 10 MG tablet Take 10 mg by mouth daily.   Yes [provider]  DULoxetine (CYMBALTA) 60 MG capsule Take 60 mg by mouth daily.   Yes [provider]  hydrochlorothiazide (HYDRODIURIL) 25 MG tablet Take 25 mg by mouth daily.   Yes [provider]  HYDROcodone-acetaminophen (NORCO) 7.5-325 MG tablet Take 1 tablet by mouth 2 (two) times daily as needed for moderate pain.   Yes [provider]  metoprolol tartrate (LOPRESSOR) 50 MG tablet Take 50-100 mg by mouth See admin instructions. Take 100 mg by mouth in the morning and 50 mg at bedtime 09/29/17  Yes [provider]  Moringa Oleifera (MORINGA PO) Take 6,000  mg by mouth daily.   Yes [provider]  Multiple Vitamins-Minerals (MULTIVITAMIN WITH MINERALS) tablet Take 1 tablet by mouth daily.   Yes [provider]  Omega-3 Fatty Acids (FISH OIL) 1200 MG CAPS Take 1,200 mg by mouth in the morning and at bedtime.   Yes [provider]  telmisartan (MICARDIS) 80 MG tablet Take 80 mg by mouth daily.  09/29/17  Yes [provider]  traZODone (DESYREL) 50 MG tablet Take 50 mg by mouth at bedtime.   Yes [provider]  vitamin B-12 (CYANOCOBALAMIN) 1000 MCG tablet Take 1,000 mcg by mouth daily.   Yes [provider]  albuterol (ACCUNEB) 0.63  MG/3ML nebulizer solution Take 1 ampule by nebulization every 6 (six) hours as needed for wheezing or shortness of breath.    [provider]  fluticasone furoate-vilanterol (BREO ELLIPTA) 100-25 MCG/ACT AEPB Inhale 1 puff into the lungs daily as needed (shortness of breath).    [provider]  polyethylene glycol-electrolytes (NULYTELY) 420 g solution As directed 04/02/21   Rourk, Gerrit Friends, MD   No Known Allergies  Social History   Tobacco Use   Smoking status: Never   Smokeless tobacco: Never  Substance Use Topics   Alcohol use: Yes    Comment: rare    Family History  Problem Relation Age of Onset   Diabetes Mother    Anxiety disorder Mother    Depression Mother    Diabetes Father    Hypertension Father    Arrhythmia Father    AAA (abdominal aortic aneurysm) Father    Epilepsy Sister    Depression Sister    Anxiety disorder Sister    Hypertension Sister    Skin cancer Sister    Hypertension Brother    Hypertension Brother    Migraines Neg Hx    Colon cancer Neg Hx    Colon polyps Neg Hx      Review of Systems  Positive ROS: neg  All other systems have been reviewed and were otherwise negative with the exception of those mentioned in the HPI and as above.  Objective: Vital signs in last 24 hours: Temp:  [98.2 F (36.8 C)] 98.2 F (36.8 C) (07/15 1007) Pulse Rate:  [65] 65 (07/15 1007) Resp:  [18] 18 (07/15 1007) BP: (137)/(87) 137/87 (07/15 1007) SpO2:  [99 %] 99 % (07/15 1007) Weight:  [121.5 kg] 121.5 kg (07/15 1007)  General Appearance: Alert, cooperative, no distress, appears stated age Head: Normocephalic, without obvious abnormality, atraumatic Eyes: PERRL, conjunctiva/corneas clear, EOM's intact    Neck: Supple, symmetrical, trachea midline Back: Symmetric, no curvature, ROM normal, no CVA tenderness Lungs:  respirations unlabored Heart: Regular rate and rhythm Abdomen: Soft, non-tender Extremities: Extremities normal, atraumatic,  no cyanosis or edema Pulses: 2+ and symmetric all extremities Skin: Skin color, texture, turgor normal, no rashes or lesions  NEUROLOGIC:   Mental status: Alert and oriented x4,  no aphasia, good attention span, fund of knowledge, and memory Motor Exam - grossly normal Sensory Exam - grossly normal Reflexes: 1= Coordination - grossly normal Gait - grossly normal Balance - grossly normal Cranial Nerves: I: smell Not tested  II: visual acuity  OS: nl    OD: nl  II: visual fields Full to confrontation  II: pupils Equal, round, reactive to light  III,VII: ptosis None  III,IV,VI: extraocular muscles  Full ROM  V: mastication Normal  V: facial light touch sensation  Normal  V,VII: corneal reflex  Present  VII: facial muscle function - upper  Normal  VII: facial muscle function - lower Normal  VIII: hearing Not tested  IX: soft palate elevation  Normal  IX,X: gag reflex Present  XI: trapezius strength  5/5  XI: sternocleidomastoid strength 5/5  XI: neck flexion strength  5/5  XII: tongue strength  Normal    Data Review Lab Results  Component Value Date   WBC 5.4 10/26/2022   HGB 14.7 10/26/2022   HCT 42.9 10/26/2022   MCV 92.3 10/26/2022   PLT 208 10/26/2022   Lab Results  Component Value Date   NA 139 10/26/2022   K 3.4 (L) 10/26/2022   CL 100 10/26/2022   CO2 30 10/26/2022   BUN 15 10/26/2022   CREATININE 0.97 10/26/2022   GLUCOSE 185 (H) 10/26/2022   Lab Results  Component Value Date   INR 0.9 10/26/2022    Assessment/Plan:  Estimated body mass index is 34.38 kg/m as calculated from the following:   Height as of this encounter: 6\' 2"  (1.88 m).   Weight as of this encounter: 121.5 kg. Patient admitted for PLIF L2-3. Patient has failed a reasonable attempt at conservative therapy.  I explained the condition and procedure to the patient and answered any questions.  Patient wishes to proceed with procedure as planned. Understands risks/ benefits and typical  outcomes of procedure.   Tia Alert 11/02/2022 12:02 PM

## 2022-11-02 NOTE — Progress Notes (Signed)
Orthopedic Tech Progress Note Patient Details:  Luke Austin [birth year] [id number]  Ortho Devices Type of Ortho Device: Lumbar corsett Ortho Device/Splint Interventions: Ordered      [name] [name] 11/02/2022, 7:12 PM

## 2022-11-02 NOTE — Transfer of Care (Signed)
Immediate Anesthesia Transfer of Care Note  Patient: Luke Austin  Procedure(s) Performed: Posterior Lumbar Interbody Fusion Lumbar Two - Lumbar Three with extension of instrumentation (Back)  Patient Location: PACU  Anesthesia Type:General  Level of Consciousness: drowsy and patient cooperative  Airway & Oxygen Therapy: Patient Spontanous Breathing and Patient connected to face mask oxygen  Post-op Assessment: Report given to RN  Post vital signs: Reviewed and stable  Last Vitals:  Vitals Value Taken Time  BP 129/72 11/02/22 1618  Temp 36.2 C 11/02/22 1618  Pulse 70 11/02/22 1624  Resp 13 11/02/22 1624  SpO2 98 % 11/02/22 1624  Vitals shown include unfiled device data.  Last Pain:  Vitals:   11/02/22 1618  TempSrc:   PainSc: 0-No pain         Complications: No notable events documented.

## 2022-11-02 NOTE — Anesthesia Procedure Notes (Addendum)
Procedure Name: Intubation Date/Time: 11/02/2022 12:26 PM  Performed by: Darryl Nestle, CRNAPre-anesthesia Checklist: Patient identified, Emergency Drugs available, Suction available and Patient being monitored Patient Re-evaluated:Patient Re-evaluated prior to induction Oxygen Delivery Method: Circle system utilized Preoxygenation: Pre-oxygenation with 100% oxygen Induction Type: IV induction Ventilation: Oral airway inserted - appropriate to patient size and Mask ventilation without difficulty Laryngoscope Size: Glidescope and 4 Grade View: Grade I Tube type: Oral Tube size: 7.0 mm Number of attempts: 1 Airway Equipment and Method: Stylet and Oral airway Placement Confirmation: ETT inserted through vocal cords under direct vision, positive ETCO2 and breath sounds checked- equal and bilateral Secured at: 23 cm Tube secured with: Tape Dental Injury: Teeth and Oropharynx as per pre-operative assessment  Comments: Performed by sam foster srna

## 2022-11-02 NOTE — Op Note (Signed)
11/02/2022  4:09 PM  PATIENT:  Luke Austin  62 y.o. 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 Decompressive lumbar laminectomy, hemi facetectomy and foraminotomies L2-3 requiring more work than would be required for a simple exposure of the disk for PLIF in order to adequately decompress the neural elements and address the spinal stenosis 2. Posterior lumbar interbody fusion L2-3 using PTI interbody cages packed with morcellized allograft and autograft  3. Posterior fixation L2-3 using Alphatec pedicle screws.  4. Intertransverse arthrodesis L2-3 using morcellized autograft and allograft. 5.  Exploration of fusion L3-4 with extraction of nonsegmental instrumentation  SURGEON:  Marikay Alar, MD  ASSISTANTS: Verlin Dike, FNP  ANESTHESIA:  General  EBL: 300 ml  Total I/O In: 2250 [I.V.:2000; IV Piggyback:250] Out: 750 [Urine:450; Blood:300]  BLOOD ADMINISTERED:none  DRAINS: none   INDICATION FOR PROCEDURE: This patient presented with back pain and leg pain. Imaging revealed recent level stenosis with retrolisthesis L2-3 above previous L3-4 fusion. The patient tried a reasonable attempt at conservative medical measures without relief. I recommended decompression and instrumented fusion to address the stenosis as well as the segmental  instability.  Patient understood the risks, benefits, and alternatives and potential outcomes and wished to proceed.  PROCEDURE DETAILS:  The patient was brought to the operating room. After induction of generalized endotracheal anesthesia the patient was rolled into the prone position on chest rolls and all pressure points were padded. The patient's lumbar region was cleaned and then prepped with DuraPrep and draped in the usual sterile fashion. Anesthesia was injected and then a dorsal midline incision was made and carried down to  the lumbosacral fascia. The fascia was opened and the paraspinous musculature was taken down in a subperiosteal fashion to expose L2-3 and the previously placed instrumentation.. A self-retaining retractor was placed. Intraoperative fluoroscopy confirmed my level, and I started with removal of the previously placed instrumentation.  I remove the locking caps from the pedicle screws of L3 and L4.  I remove the rods.  I had to drill around the pedicle screws of L3 to remove them.  Left L4 pedicle screws in place.   I then turned my attention to the decompression and complete lumbar laminectomies, hemi- facetectomies, and foraminotomies were performed at L2-3.  My nurse practitioner was directly involved in the decompression and exposure of the neural elements. the patient had significant spinal stenosis and this required more work than would be required for a simple exposure of the disc for posterior lumbar interbody fusion which would only require a limited laminotomy. Much more generous decompression and generous foraminotomy was undertaken in order to adequately decompress the neural elements and address the patient's leg pain. The yellow ligament was removed to expose the underlying dura and nerve roots, and generous foraminotomies were performed to adequately decompress the neural elements. Both the exiting and traversing nerve roots were decompressed on both sides until a coronary dilator passed easily along the nerve roots. Once the decompression was complete, I turned my attention to the posterior lower lumbar interbody fusion. The epidural venous vasculature was coagulated and cut sharply. Disc space was incised and the initial discectomy was performed with pituitary rongeurs. The disc space was distracted with sequential distractors to a height of 9 mm. We then used a series of scrapers and shavers to prepare the endplates for fusion. The midline was prepared with Epstein curettes. Once the  complete  discectomy was finished, we packed an appropriate sized interbody cage with local autograft and morcellized allograft, gently retracted the nerve root, and tapped the cage into position at L2-3.  The midline between the cages was packed with morselized autograft and allograft.   We then turned our attention to the placement of the L2 pedicle screws. The pedicle screw entry zones were identified utilizing surface landmarks and fluoroscopy. I drilled into each pedicle and then tapped each pedicle with a 5.5 tap.   We palpated with a ball probe to assure no break in the cortex. We then placed 6.5 x 50 mm pedicle screws into the pedicles bilaterally at L2.  We placed a 6.5 x 45 mm pedicle screws into the old pedicle screw holes of L3.  My nurse practitioner assisted in placement of the pedicle screws.  We then decorticated the transverse processes and laid a mixture of morcellized autograft and allograft out over these to perform intertransverse arthrodesis at L2-3. We then placed lordotic rods into the multiaxial screw heads of the pedicle screws and locked these in position with the locking caps and anti-torque device. We then checked our construct with AP and lateral fluoroscopy. Irrigated with copious amounts of 0.5% povidone iodine solution followed by saline solution. Inspected the nerve roots once again to assure adequate decompression, lined to the dura with Gelfoam,  and then we closed the muscle and the fascia with 0 Vicryl. Closed the subcutaneous tissues with 2-0 Vicryl and subcuticular tissues with 3-0 Vicryl. The skin was closed with benzoin and Steri-Strips. Dressing was then applied, the patient was awakened from general anesthesia and transported to the recovery room in stable condition. At the end of the procedure all sponge, needle and instrument counts were correct.   PLAN OF CARE: admit to inpatient  PATIENT DISPOSITION:  PACU - hemodynamically stable.   Delay start of Pharmacological VTE  agent (>24hrs) due to surgical blood loss or risk of bleeding:  yes

## 2022-11-03 MED ORDER — TIZANIDINE HCL 2 MG PO TABS: 4.0000 mg | ORAL_TABLET | Freq: Three times a day (TID) | ORAL | 1 refills | Status: AC | PRN

## 2022-11-03 MED ORDER — HYDROCODONE-ACETAMINOPHEN 7.5-325 MG PO TABS: 1.0000 | ORAL_TABLET | ORAL | 0 refills | Status: AC | PRN

## 2022-11-03 NOTE — Progress Notes (Signed)
Patient alert and oriented, mae's well, voiding adequate amount of urine, swallowing without difficulty, no c/o pain at time of discharge. Patient discharged home with family. Script and discharged instructions given to patient. Patient stated understanding of instructions given. Patient has an appointment with Dr. Ronnald Ramp in 2 weeks

## 2022-11-03 NOTE — Anesthesia Postprocedure Evaluation (Signed)
Anesthesia Post Note  Patient: Luke Austin  Procedure(s) Performed: Posterior Lumbar Interbody Fusion Lumbar Two - Lumbar Three with extension of instrumentation (Back)     Patient location during evaluation: PACU Anesthesia Type: General Level of consciousness: awake and alert Pain management: pain level controlled Vital Signs Assessment: post-procedure vital signs reviewed and stable Respiratory status: spontaneous breathing, nonlabored ventilation, respiratory function stable and patient connected to nasal cannula oxygen Cardiovascular status: blood pressure returned to baseline and stable Postop Assessment: no apparent nausea or vomiting Anesthetic complications: no   No notable events documented.  Last Vitals:  Vitals:   11/03/22 0531 11/03/22 0819  BP: (!) 144/80 130/80  Pulse: 73 87  Resp: 20 18  Temp: 36.4 C   SpO2: 100% 99%    Last Pain:  Vitals:   11/03/22 1026  TempSrc:   PainSc: 3                  Diala Waxman S

## 2022-11-03 NOTE — Evaluation (Signed)
Occupational Therapy Evaluation Patient Details Name: Luke Austin MRN: [id number] DOB: 06-30-1960 Today's Date: 11/03/2022   History of Present Illness 62 yo M s/p PLIF.  PMH includes: Arthritic, GERD, HTN, CA, prior back surgery.   Clinical Impression   Patient admitted for the procedure above.  PTA he lives alone, but has good support from church members.  Patient has a good understanding of all back precautions and brace management, and is essentially at his baseline for ADL and mobility.  Patient able to walk the halls without an AD, and climb a full flight of stairs.  No further OT needs in the acute setting, recommend follow up with the MD as prescribed.        Recommendations for follow up therapy are one component of a multi-disciplinary discharge planning process, led by the attending physician.  Recommendations may be updated based on patient status, additional functional criteria and insurance authorization.   Assistance Recommended at Discharge PRN  Patient can return home with the following Assist for transportation    Functional Status Assessment  Patient has not had a recent decline in their functional status  Equipment Recommendations  None recommended by OT    Recommendations for Other Services       Precautions / Restrictions Precautions Precautions: Back Precaution Booklet Issued: Yes (comment) Precaution Comments: Bending Lifting Twisting Restrictions Weight Bearing Restrictions: No      Mobility Bed Mobility Overal bed mobility: Modified Independent                  Transfers Overall transfer level: Modified independent                        Balance Overall balance assessment: Mild deficits observed, not formally tested                                         ADL either performed or assessed with clinical judgement   ADL Overall ADL's : At baseline                                        General ADL Comments: Difficulty with figure four     Vision Baseline Vision/History: 1 Wears glasses Patient Visual Report: No change from baseline       Perception     Praxis      Pertinent Vitals/Pain Pain Assessment Pain Assessment: Faces Faces Pain Scale: Hurts little more Pain Location: Incisional Pain Descriptors / Indicators: Sore, Tender Pain Intervention(s): Monitored during session     Hand Dominance Right   Extremity/Trunk Assessment Upper Extremity Assessment Upper Extremity Assessment: Overall WFL for tasks assessed   Lower Extremity Assessment Lower Extremity Assessment: Overall WFL for tasks assessed   Cervical / Trunk Assessment Cervical / Trunk Assessment: Back Surgery   Communication Communication Communication: No difficulties   Cognition Arousal/Alertness: Awake/alert Behavior During Therapy: WFL for tasks assessed/performed Overall Cognitive Status: Within Functional Limits for tasks assessed                                       General Comments   VSS on RA    Exercises     Shoulder Instructions  Home Living Family/patient expects to be discharged to:: Private residence Living Arrangements: Alone Available Help at Discharge: Family Type of Home: House Home Access: Stairs to enter [employer] of Steps: 4 Entrance Stairs-Rails: None Home Layout: One level     Bathroom Shower/Tub: Tub/shower unit;Walk-in shower   Bathroom Toilet: Standard Bathroom Accessibility: Yes   Home Equipment: Shower seat;Cane - single Librarian, academic (2 wheels);BSC/3in1          Prior Functioning/Environment Prior Level of Function : Independent/Modified Independent;Driving                        OT Problem List: Pain      OT Treatment/Interventions:      OT Goals(Current goals can be found in the care plan section) Acute Rehab OT Goals Patient Stated Goal: Return home OT Goal Formulation: With  patient Time For Goal Achievement: 11/06/22 Potential to Achieve Goals: Good  OT Frequency:      Co-evaluation              AM-PAC OT "6 Clicks" Daily Activity     Outcome Measure Help from another person eating meals?: None Help from another person taking care of personal grooming?: None Help from another person toileting, which includes using toliet, bedpan, or urinal?: None Help from another person bathing (including washing, rinsing, drying)?: None Help from another person to put on and taking off regular upper body clothing?: None Help from another person to put on and taking off regular lower body clothing?: None 6 Click Score: 24   End of Session Equipment Utilized During Treatment: Back brace Nurse Communication: Mobility status  Activity Tolerance: Patient tolerated treatment well Patient left: in chair;with call bell/phone within reach  OT Visit Diagnosis: Unsteadiness on feet (R26.81)                Time: 2841-3244 OT Time Calculation (min): 25 min Charges:  OT General Charges $OT Visit: 1 Visit OT Evaluation $OT Eval Moderate Complexity: 1 Mod OT Treatments $Self Care/Home Management : 8-22 mins  11/03/2022  RP, OTR/L  Acute Rehabilitation Services  Office:  [phone]   Suzanna Obey 11/03/2022, 8:43 AM

## 2022-11-03 NOTE — Progress Notes (Signed)
PT Cancellation Note and Discharge  Patient Details Name: Luke Austin MRN: [id number] DOB: June 09, 1960   Cancelled Treatment:    Reason Eval/Treat Not Completed: PT screened, no needs identified, will sign off. Discussed pt case with OT who reports pt is currently mobilizing at an independent level and does not require a formal PT evaluation at this time. PT signing off. If needs change, please reconsult.     Marylynn Pearson 11/03/2022, 8:40 AM  Conni Slipper, PT, DPT [facility] Secure Chat Preferred Office: [phone]

## 2022-11-03 NOTE — Discharge Summary (Signed)
Physician Discharge Summary  Patient ID: Luke Austin MRN: [id number] DOB/AGE: 1960/06/12 62 y.o.  Admit date: 11/02/2022 Discharge date: 11/03/2022  Admission Diagnoses: adjacent level stenosis    Discharge Diagnoses: same   Discharged Condition: good  Hospital Course: The patient was admitted on 11/02/2022 and taken to the operating room where the patient underwent PLIF L2-3. The patient tolerated the procedure well and was taken to the recovery room and then to the floor in stable condition. The hospital course was routine. There were no complications. The wound remained clean dry and intact. Pt had appropriate back soreness. No complaints of leg pain or new N/T/W. The patient remained afebrile with stable vital signs, and tolerated a regular diet. The patient continued to increase activities, and pain was well controlled with oral pain medications.   Consults: None  Significant Diagnostic Studies:  Results for orders placed or performed during the hospital encounter of 11/02/22  Glucose, capillary  Result Value Ref Range   Glucose-Capillary 126 (H) 70 - 99 mg/dL  ABO/Rh  Result Value Ref Range   ABO/RH(D)      A POS Performed at [facility], [address] [place], [place] [address]     DG Lumbar Spine 2-3 Views  Result Date: 11/02/2022 CLINICAL DATA:  Elective surgery. Posterior lumbar interbody fusion lumbar to through lumbar 3 with extension of instrumentation. EXAM: LUMBAR SPINE - 2-3 VIEW COMPARISON:  05/14/2022 FINDINGS: Intraoperative fluoroscopy is obtained for surgical control purposes. Fluoroscopy time is recorded at 48.6 seconds. Dose 41.47 mGy. Five spot fluoroscopic images are obtained. Spot fluoroscopic images demonstrate apparent removal of previous posterior fixation hardware at L3-4, leaving the L4 pedicle screws. New pedicle screws and posterior rods are placed at L2-3 with placement of an intervertebral disc prosthesis at that level. Pre-existing  prosthesis at L3-4 is unchanged. IMPRESSION: Intraoperative fluoroscopy is obtained for surgical control purposes during posterior fixation procedure at L2-3. Electronically Signed   By: Burman Nieves M.D.   On: 11/02/2022 16:43   DG C-Arm 1-60 Min-No Report  Result Date: 11/02/2022 Fluoroscopy was utilized by the requesting physician.  No radiographic interpretation.   DG C-Arm 1-60 Min-No Report  Result Date: 11/02/2022 Fluoroscopy was utilized by the requesting physician.  No radiographic interpretation.   DG C-Arm 1-60 Min-No Report  Result Date: 11/02/2022 Fluoroscopy was utilized by the requesting physician.  No radiographic interpretation.   DG C-Arm 1-60 Min-No Report  Result Date: 11/02/2022 Fluoroscopy was utilized by the requesting physician.  No radiographic interpretation.    Antibiotics:  Anti-infectives (From admission, onward)    Start     Dose/Rate Route Frequency Ordered Stop   11/02/22 2200  ceFAZolin (ANCEF) IVPB 2g/100 mL premix        2 g 200 mL/hr over 30 Minutes Intravenous Every 8 hours 11/02/22 1709 11/03/22 0556   11/02/22 1000  ceFAZolin (ANCEF) IVPB 3g/100 mL premix        3 g 200 mL/hr over 30 Minutes Intravenous On call to O.R. 11/02/22 0949 11/02/22 1308   11/02/22 0949  vancomycin (VANCOREADY) IVPB 1500 mg/300 mL        1,500 mg 150 mL/hr over 120 Minutes Intravenous 60 min pre-op 11/02/22 0949 11/02/22 1259       Discharge Exam: Blood pressure 130/80, pulse 87, temperature 97.6 F (36.4 C), temperature source Oral, resp. rate 18, height 6\' 2"  (1.88 m), weight 121.5 kg, SpO2 99%. Neurologic: Grossly normal Dressing dry  Discharge Medications:   Allergies  as of 11/03/2022       Reactions   Lactose Intolerance (gi)         Medication List     TAKE these medications    acetaminophen 500 MG tablet Commonly known as: TYLENOL Take 500 mg by mouth in the morning and at bedtime.   albuterol 0.63 MG/3ML nebulizer solution Commonly  known as: ACCUNEB Take 1 ampule by nebulization every 6 (six) hours as needed for wheezing or shortness of breath.   albuterol 108 (90 Base) MCG/ACT inhaler Commonly known as: VENTOLIN HFA Inhale 1-2 puffs into the lungs every 6 (six) hours as needed for wheezing or shortness of breath.   amLODipine 10 MG tablet Commonly known as: NORVASC Take 10 mg by mouth daily.   cetirizine 10 MG tablet Commonly known as: ZYRTEC Take 10 mg by mouth daily.   cyanocobalamin 1000 MCG tablet Commonly known as: VITAMIN B12 Take 1,000 mcg by mouth daily.   DULoxetine 60 MG capsule Commonly known as: CYMBALTA Take 60 mg by mouth daily.   Fish Oil 1200 MG Caps Take 1,200 mg by mouth in the morning and at bedtime.   fluticasone furoate-vilanterol 100-25 MCG/ACT Aepb Commonly known as: BREO ELLIPTA Inhale 1 puff into the lungs daily as needed (shortness of breath).   hydrochlorothiazide 25 MG tablet Commonly known as: HYDRODIURIL Take 25 mg by mouth daily.   HYDROcodone-acetaminophen 7.5-325 MG tablet Commonly known as: NORCO Take 1 tablet by mouth every 4 (four) hours as needed for moderate pain. What changed: when to take this   metoprolol tartrate 50 MG tablet Commonly known as: LOPRESSOR Take 50-100 mg by mouth See admin instructions. Take 100 mg by mouth in the morning and 50 mg at bedtime   MORINGA PO Take 6,000 mg by mouth daily.   multivitamin with minerals tablet Take 1 tablet by mouth daily.   polyethylene glycol-electrolytes 420 g solution Commonly known as: NuLYTELY As directed   telmisartan 80 MG tablet Commonly known as: MICARDIS Take 80 mg by mouth daily.   tiZANidine 2 MG tablet Commonly known as: ZANAFLEX Take 2 tablets (4 mg total) by mouth every 8 (eight) hours as needed for muscle spasms.   traZODone 50 MG tablet Commonly known as: DESYREL Take 50 mg by mouth at bedtime.               Durable Medical Equipment  (From admission, onward)            Start     Ordered   11/02/22 1739  DME Walker rolling  Once       Question:  Patient needs a walker to treat with the following condition  Answer:  S/P lumbar fusion   11/02/22 1738   11/02/22 1739  DME 3 n 1  Once        11/02/22 1738            Disposition: home   Final Dx: PLIF L2-3  Discharge Instructions      Remove dressing in 72 hours   Complete by: As directed    Call MD for:  difficulty breathing, headache or visual disturbances   Complete by: As directed    Call MD for:  persistant nausea and vomiting   Complete by: As directed    Call MD for:  redness, tenderness, or signs of infection (pain, swelling, redness, odor or green/yellow discharge around incision site)   Complete by: As directed    Call MD for:  severe uncontrolled pain   Complete by: As directed    Call MD for:  temperature >100.4   Complete by: As directed    Diet - low sodium heart healthy   Complete by: As directed    Increase activity slowly   Complete by: As directed           Signed: Tia Alert 11/03/2022, 8:34 AM

## 2022-11-13 DIAGNOSIS — Z96643 Presence of artificial hip joint, bilateral: Secondary | ICD-10-CM | POA: Diagnosis not present

## 2022-11-13 DIAGNOSIS — R109 Unspecified abdominal pain: Secondary | ICD-10-CM | POA: Diagnosis not present

## 2022-11-13 DIAGNOSIS — Z960 Presence of urogenital implants: Secondary | ICD-10-CM | POA: Diagnosis not present

## 2022-11-13 DIAGNOSIS — K4090 Unilateral inguinal hernia, without obstruction or gangrene, not specified as recurrent: Secondary | ICD-10-CM | POA: Diagnosis not present

## 2022-11-13 DIAGNOSIS — N509 Disorder of male genital organs, unspecified: Secondary | ICD-10-CM | POA: Diagnosis not present

## 2022-11-13 DIAGNOSIS — R350 Frequency of micturition: Secondary | ICD-10-CM | POA: Diagnosis not present

## 2022-11-13 DIAGNOSIS — N390 Urinary tract infection, site not specified: Secondary | ICD-10-CM | POA: Diagnosis not present

## 2022-11-13 DIAGNOSIS — N200 Calculus of kidney: Secondary | ICD-10-CM | POA: Diagnosis not present

## 2022-12-07 DIAGNOSIS — R300 Dysuria: Secondary | ICD-10-CM | POA: Diagnosis not present

## 2022-12-07 DIAGNOSIS — N390 Urinary tract infection, site not specified: Secondary | ICD-10-CM | POA: Diagnosis not present

## 2022-12-10 DIAGNOSIS — I10 Essential (primary) hypertension: Secondary | ICD-10-CM | POA: Diagnosis not present

## 2022-12-10 DIAGNOSIS — R7303 Prediabetes: Secondary | ICD-10-CM | POA: Diagnosis not present

## 2022-12-10 DIAGNOSIS — Z125 Encounter for screening for malignant neoplasm of prostate: Secondary | ICD-10-CM | POA: Diagnosis not present

## 2022-12-17 DIAGNOSIS — I700 Atherosclerosis of aorta: Secondary | ICD-10-CM | POA: Diagnosis not present

## 2022-12-17 DIAGNOSIS — E229 Hyperfunction of pituitary gland, unspecified: Secondary | ICD-10-CM | POA: Diagnosis not present

## 2022-12-17 DIAGNOSIS — J45909 Unspecified asthma, uncomplicated: Secondary | ICD-10-CM | POA: Diagnosis not present

## 2022-12-17 DIAGNOSIS — R972 Elevated prostate specific antigen [PSA]: Secondary | ICD-10-CM | POA: Diagnosis not present

## 2022-12-17 DIAGNOSIS — E291 Testicular hypofunction: Secondary | ICD-10-CM | POA: Diagnosis not present

## 2022-12-17 DIAGNOSIS — T8149XA Infection following a procedure, other surgical site, initial encounter: Secondary | ICD-10-CM | POA: Diagnosis not present

## 2022-12-17 DIAGNOSIS — F5104 Psychophysiologic insomnia: Secondary | ICD-10-CM | POA: Diagnosis not present

## 2022-12-17 DIAGNOSIS — Z1212 Encounter for screening for malignant neoplasm of rectum: Secondary | ICD-10-CM | POA: Diagnosis not present

## 2022-12-17 DIAGNOSIS — Z8659 Personal history of other mental and behavioral disorders: Secondary | ICD-10-CM | POA: Diagnosis not present

## 2022-12-17 DIAGNOSIS — I10 Essential (primary) hypertension: Secondary | ICD-10-CM | POA: Diagnosis not present

## 2022-12-17 DIAGNOSIS — I2510 Atherosclerotic heart disease of native coronary artery without angina pectoris: Secondary | ICD-10-CM | POA: Diagnosis not present

## 2022-12-17 DIAGNOSIS — N529 Male erectile dysfunction, unspecified: Secondary | ICD-10-CM | POA: Diagnosis not present

## 2022-12-17 DIAGNOSIS — Z0000 Encounter for general adult medical examination without abnormal findings: Secondary | ICD-10-CM | POA: Diagnosis not present

## 2023-01-25 ENCOUNTER — Ambulatory Visit: Payer: Medicare HMO | Admitting: Urology

## 2023-01-25 DIAGNOSIS — R7989 Other specified abnormal findings of blood chemistry: Secondary | ICD-10-CM

## 2023-01-29 DIAGNOSIS — F339 Major depressive disorder, recurrent, unspecified: Secondary | ICD-10-CM | POA: Diagnosis not present

## 2023-01-29 DIAGNOSIS — A4902 Methicillin resistant Staphylococcus aureus infection, unspecified site: Secondary | ICD-10-CM | POA: Diagnosis not present

## 2023-01-29 DIAGNOSIS — N5319 Other ejaculatory dysfunction: Secondary | ICD-10-CM | POA: Diagnosis not present

## 2023-03-03 DIAGNOSIS — N5319 Other ejaculatory dysfunction: Secondary | ICD-10-CM | POA: Diagnosis not present

## 2023-03-03 DIAGNOSIS — F339 Major depressive disorder, recurrent, unspecified: Secondary | ICD-10-CM | POA: Diagnosis not present

## 2023-03-04 DIAGNOSIS — E291 Testicular hypofunction: Secondary | ICD-10-CM | POA: Diagnosis not present

## 2023-03-04 DIAGNOSIS — M5416 Radiculopathy, lumbar region: Secondary | ICD-10-CM | POA: Diagnosis not present

## 2023-03-04 DIAGNOSIS — Z6836 Body mass index (BMI) 36.0-36.9, adult: Secondary | ICD-10-CM | POA: Diagnosis not present

## 2023-06-15 DIAGNOSIS — E291 Testicular hypofunction: Secondary | ICD-10-CM | POA: Diagnosis not present

## 2023-06-15 DIAGNOSIS — I10 Essential (primary) hypertension: Secondary | ICD-10-CM | POA: Diagnosis not present

## 2023-06-15 DIAGNOSIS — N529 Male erectile dysfunction, unspecified: Secondary | ICD-10-CM | POA: Diagnosis not present

## 2023-06-17 DIAGNOSIS — G4733 Obstructive sleep apnea (adult) (pediatric): Secondary | ICD-10-CM | POA: Diagnosis not present

## 2023-06-17 DIAGNOSIS — I2510 Atherosclerotic heart disease of native coronary artery without angina pectoris: Secondary | ICD-10-CM | POA: Diagnosis not present

## 2023-06-17 DIAGNOSIS — I10 Essential (primary) hypertension: Secondary | ICD-10-CM | POA: Diagnosis not present

## 2023-06-17 DIAGNOSIS — J45909 Unspecified asthma, uncomplicated: Secondary | ICD-10-CM | POA: Diagnosis not present

## 2023-06-17 DIAGNOSIS — Z8659 Personal history of other mental and behavioral disorders: Secondary | ICD-10-CM | POA: Diagnosis not present

## 2023-06-17 DIAGNOSIS — E118 Type 2 diabetes mellitus with unspecified complications: Secondary | ICD-10-CM | POA: Diagnosis not present
